# Patient Record
Sex: Male | Born: 1945 | Race: White | Hispanic: No | Smoking: Never smoker
Health system: Southern US, Community
[De-identification: ages and names within clinical notes are randomized; demographics above are authoritative.]

## PROBLEM LIST (undated history)

## (undated) DIAGNOSIS — E785 Hyperlipidemia, unspecified: Secondary | ICD-10-CM

## (undated) DIAGNOSIS — I10 Essential (primary) hypertension: Secondary | ICD-10-CM

## (undated) HISTORY — DX: Hyperlipidemia, unspecified: E78.5

## (undated) HISTORY — DX: Essential (primary) hypertension: I10

---

## 1998-07-14 ENCOUNTER — Encounter: Admission: RE | Admit: 1998-07-14 | Discharge: 1998-07-14 | Payer: Self-pay | Admitting: *Deleted

## 2010-09-30 ENCOUNTER — Encounter: Payer: Self-pay | Admitting: Family Medicine

## 2010-09-30 DIAGNOSIS — I10 Essential (primary) hypertension: Secondary | ICD-10-CM | POA: Insufficient documentation

## 2012-09-30 ENCOUNTER — Telehealth: Payer: Self-pay | Admitting: Family Medicine

## 2012-09-30 MED ORDER — BENAZEPRIL-HYDROCHLOROTHIAZIDE 10-12.5 MG PO TABS: 1.0000 | ORAL_TABLET | Freq: Every day | ORAL | Status: DC

## 2012-09-30 NOTE — Telephone Encounter (Signed)
Rx Refilled  

## 2013-03-03 ENCOUNTER — Encounter: Payer: Self-pay | Admitting: Family Medicine

## 2013-03-03 ENCOUNTER — Ambulatory Visit (INDEPENDENT_AMBULATORY_CARE_PROVIDER_SITE_OTHER): Payer: Medicare PPO | Admitting: Family Medicine

## 2013-03-03 VITALS — BP 134/86 | HR 62 | Temp 98.1°F | Resp 16 | Ht 70.0 in | Wt 196.0 lb

## 2013-03-03 DIAGNOSIS — I10 Essential (primary) hypertension: Secondary | ICD-10-CM

## 2013-03-03 DIAGNOSIS — Z125 Encounter for screening for malignant neoplasm of prostate: Secondary | ICD-10-CM

## 2013-03-03 LAB — COMPLETE METABOLIC PANEL WITH GFR
Albumin: 4.1 g/dL (ref 3.5–5.2)
CO2: 28 mEq/L (ref 19–32)
GFR, Est African American: 82 mL/min
GFR, Est Non African American: 71 mL/min
Glucose, Bld: 91 mg/dL (ref 70–99)
Potassium: 4.4 mEq/L (ref 3.5–5.3)
Sodium: 138 mEq/L (ref 135–145)
Total Protein: 7 g/dL (ref 6.0–8.3)

## 2013-03-03 LAB — PSA: PSA: 0.37 ng/mL (ref <=4.00)

## 2013-03-03 MED ORDER — BENAZEPRIL-HYDROCHLOROTHIAZIDE 10-12.5 MG PO TABS: 1.0000 | ORAL_TABLET | Freq: Every day | ORAL | Status: DC

## 2013-03-03 NOTE — Progress Notes (Signed)
  Subjective:    Patient ID: Daniel Gallagher, male    DOB: Sep 06, 1945, 67 y.o.   MRN: [id number]  HPI  Patient is a very pleasant 67 year old white male who comes in for followup of his hypertension. He is currently taking Lotensin HCT 10/12.5 one tablet by mouth daily. He denies any chest pain, shortness of breath, dyspnea on exertion, cough, or cramps. He is overdue to have his prostate checked. He declines a rectal exam today but he will consent to PSA. He is also overdue to have his lab work drawn. He had a pneumonia vaccine last year. He declines a flu shot. He declines a shingle shot. He also declines a colonoscopy. We continue to recommend these every year but the patient is not interested at this time. Past Medical History  Diagnosis Date  . Hypertension    No current outpatient prescriptions on file prior to visit.   No current facility-administered medications on file prior to visit.   Allergies  Allergen Reactions  . Statins Other (See Comments)    Severe myalgias   History   Social History  . Marital Status: Unknown    Spouse Name: N/A    Number of Children: N/A  . Years of Education: N/A   Occupational History  . Not on file.   Social History Main Topics  . Smoking status: Never Smoker   . Smokeless tobacco: Not on file  . Alcohol Use: No  . Drug Use: No  . Sexual Activity:    Other Topics Concern  . Not on file   Social History Narrative  . No narrative on file     Review of Systems  All other systems reviewed and are negative.       Objective:   Physical Exam  Vitals reviewed. Constitutional: He appears well-developed and well-nourished.  Neck: Neck supple. No JVD present. No thyromegaly present.  Cardiovascular: Normal rate, regular rhythm, normal heart sounds and intact distal pulses.  Exam reveals no gallop and no friction rub.   No murmur heard. Pulmonary/Chest: Effort normal and breath sounds normal. No respiratory distress. He has no wheezes. He  has no rales. He exhibits no tenderness.  Abdominal: Soft. Bowel sounds are normal. He exhibits no distension and no mass. There is no tenderness. There is no rebound and no guarding.  Musculoskeletal: He exhibits no edema.  Lymphadenopathy:    He has no cervical adenopathy.          Assessment & Plan:  1. HTN (hypertension) Patient's blood pressures acceptable today. I refilled his medication for a year. I will check a CMP and a fasting lipid panel. His goal LDL would be less than 130. Also make sure that his renal function and potassium are acceptable - COMPLETE METABOLIC PANEL WITH GFR - Lipid panel  2. Prostate cancer screening H. declines digital rectal exam today. He will consent to PSA. He also declines a colonoscopy, and a shingle shot, and the flu shot. He understands the risk and benefits of shots/ test and elected not to proceed with any. - PSA

## 2014-03-10 ENCOUNTER — Encounter: Payer: Self-pay | Admitting: Family Medicine

## 2014-03-10 ENCOUNTER — Ambulatory Visit (INDEPENDENT_AMBULATORY_CARE_PROVIDER_SITE_OTHER): Payer: Medicare PPO | Admitting: Family Medicine

## 2014-03-10 VITALS — BP 126/80 | HR 56 | Temp 97.6°F | Resp 18 | Ht 69.0 in | Wt 193.0 lb

## 2014-03-10 DIAGNOSIS — I10 Essential (primary) hypertension: Secondary | ICD-10-CM

## 2014-03-10 DIAGNOSIS — E785 Hyperlipidemia, unspecified: Secondary | ICD-10-CM

## 2014-03-10 LAB — COMPLETE METABOLIC PANEL WITH GFR
ALBUMIN: 4.2 g/dL (ref 3.5–5.2)
ALK PHOS: 57 U/L (ref 39–117)
ALT: 25 U/L (ref 0–53)
AST: 19 U/L (ref 0–37)
BUN: 11 mg/dL (ref 6–23)
CO2: 29 mEq/L (ref 19–32)
CREATININE: 1.03 mg/dL (ref 0.50–1.35)
Calcium: 9.5 mg/dL (ref 8.4–10.5)
Chloride: 101 mEq/L (ref 96–112)
GFR, EST NON AFRICAN AMERICAN: 74 mL/min
GFR, Est African American: 86 mL/min
GLUCOSE: 93 mg/dL (ref 70–99)
Potassium: 4.3 mEq/L (ref 3.5–5.3)
Sodium: 138 mEq/L (ref 135–145)
Total Bilirubin: 0.6 mg/dL (ref 0.2–1.2)
Total Protein: 7.1 g/dL (ref 6.0–8.3)

## 2014-03-10 LAB — LIPID PANEL
CHOL/HDL RATIO: 5.9 ratio
CHOLESTEROL: 231 mg/dL — AB (ref 0–200)
HDL: 39 mg/dL — ABNORMAL LOW (ref >39)
LDL Cholesterol: 164 mg/dL — ABNORMAL HIGH (ref 0–99)
TRIGLYCERIDES: 141 mg/dL (ref <150)
VLDL: 28 mg/dL (ref 0–40)

## 2014-03-10 NOTE — Progress Notes (Signed)
   Subjective:    Patient ID: Daniel Gallagher, male    DOB: 02/24/1946, 68 y.o.   MRN: [id number]  HPI Patient has a history of HTN and is currently on lotensin hctz 10/12.5 poqday.  He also has a history of hld with ldl >200 but refuses statins due to a history of myalgias.  He denies any cp, sob, doe.  He refuses flu shot, prevnar, zostavax.   Past Medical History  Diagnosis Date  . Hypertension   . Hyperlipidemia    No past surgical history on file. Current Outpatient Prescriptions on File Prior to Visit  Medication Sig Dispense Refill  . benazepril-hydrochlorthiazide (LOTENSIN HCT) 10-12.5 MG per tablet Take 1 tablet by mouth daily.  30 tablet  11   No current facility-administered medications on file prior to visit.   Allergies  Allergen Reactions  . Statins Other (See Comments)    Severe myalgias   History   Social History  . Marital Status: Unknown    Spouse Name: N/A    Number of Children: N/A  . Years of Education: N/A   Occupational History  . Not on file.   Social History Main Topics  . Smoking status: Never Smoker   . Smokeless tobacco: Never Used  . Alcohol Use: No  . Drug Use: No  . Sexual Activity: Not on file   Other Topics Concern  . Not on file   Social History Narrative  . No narrative on file      Review of Systems  All other systems reviewed and are negative.      Objective:   Physical Exam  Vitals reviewed. Constitutional: He appears well-developed and well-nourished.  Neck: Neck supple. No JVD present. No thyromegaly present.  Cardiovascular: Normal rate, regular rhythm and normal heart sounds.   No murmur heard. Pulmonary/Chest: Effort normal and breath sounds normal. No respiratory distress. He has no wheezes. He has no rales.  Abdominal: Soft. Bowel sounds are normal. He exhibits no distension. There is no tenderness. There is no rebound and no guarding.  Musculoskeletal: He exhibits no edema.  Lymphadenopathy:    He has no cervical  adenopathy.          Assessment & Plan:  Essential hypertension - Plan: COMPLETE METABOLIC PANEL WITH GFR, Lipid panel  HLD (hyperlipidemia)  Blood pressure is excellent.  Check FLP.  If ldl is greater than 130, I would recommend zetia.

## 2014-03-13 ENCOUNTER — Telehealth: Payer: Self-pay | Admitting: Family Medicine

## 2014-03-13 MED ORDER — EZETIMIBE 10 MG PO TABS: 10.0000 mg | ORAL_TABLET | Freq: Every day | ORAL | Status: DC

## 2014-03-13 NOTE — Telephone Encounter (Signed)
Pt aware of lab results and provider recommendations.  Zetia rx to pharmacy

## 2014-03-13 NOTE — Telephone Encounter (Signed)
Message copied by Donne Anon on Fri Mar 13, 2014  4:52 PM ------      Message from: Lynnea Ferrier      Created: Thu Mar 12, 2014  7:26 AM       ldl is still poor.  I would suggest zetia 10 mg poqday and recheck in 3 months. ------

## 2014-03-30 ENCOUNTER — Other Ambulatory Visit: Payer: Self-pay | Admitting: *Deleted

## 2014-03-30 MED ORDER — BENAZEPRIL-HYDROCHLOROTHIAZIDE 10-12.5 MG PO TABS
1.0000 | ORAL_TABLET | Freq: Every day | ORAL | Status: DC
Start: 2014-03-30 — End: 2015-03-26

## 2014-03-30 NOTE — Telephone Encounter (Signed)
Received fax requesting refill on Benaepril/HCTZ.   Refill appropriate and filled per protocol.

## 2015-03-04 ENCOUNTER — Encounter: Payer: Self-pay | Admitting: Family Medicine

## 2015-03-04 ENCOUNTER — Ambulatory Visit (INDEPENDENT_AMBULATORY_CARE_PROVIDER_SITE_OTHER): Payer: Medicare PPO | Admitting: Family Medicine

## 2015-03-04 VITALS — BP 138/88 | HR 60 | Temp 98.0°F | Resp 16 | Wt 193.0 lb

## 2015-03-04 DIAGNOSIS — Z125 Encounter for screening for malignant neoplasm of prostate: Secondary | ICD-10-CM

## 2015-03-04 DIAGNOSIS — E785 Hyperlipidemia, unspecified: Secondary | ICD-10-CM

## 2015-03-04 DIAGNOSIS — I10 Essential (primary) hypertension: Secondary | ICD-10-CM

## 2015-03-04 LAB — COMPLETE METABOLIC PANEL WITH GFR
ALT: 20 U/L (ref 9–46)
AST: 17 U/L (ref 10–35)
Albumin: 4 g/dL (ref 3.6–5.1)
Alkaline Phosphatase: 65 U/L (ref 40–115)
BILIRUBIN TOTAL: 0.7 mg/dL (ref 0.2–1.2)
BUN: 12 mg/dL (ref 7–25)
CALCIUM: 9.4 mg/dL (ref 8.6–10.3)
CO2: 25 mmol/L (ref 20–31)
CREATININE: 0.91 mg/dL (ref 0.70–1.25)
Chloride: 103 mmol/L (ref 98–110)
GFR, Est Non African American: 86 mL/min (ref >=60)
Glucose, Bld: 87 mg/dL (ref 70–99)
Potassium: 4 mmol/L (ref 3.5–5.3)
Sodium: 139 mmol/L (ref 135–146)
TOTAL PROTEIN: 6.7 g/dL (ref 6.1–8.1)

## 2015-03-04 LAB — LIPID PANEL
CHOLESTEROL: 255 mg/dL — AB (ref 125–200)
HDL: 43 mg/dL (ref >=40)
LDL CALC: 186 mg/dL — AB (ref <130)
TRIGLYCERIDES: 130 mg/dL (ref <150)
Total CHOL/HDL Ratio: 5.9 Ratio — ABNORMAL HIGH (ref <=5.0)
VLDL: 26 mg/dL (ref <30)

## 2015-03-04 NOTE — Progress Notes (Signed)
   Subjective:    Patient ID: Daniel Gallagher., male    DOB: Mar 11, 1946, 69 y.o.   MRN: [id number]  HPI Patient is here today for recheck. He quit zetia due to episodes of vertigo that he attributed to the medication. He has been on no medication for almost a year. He is taking his blood pressure medication. He denies any chest pain shortness breath or dyspnea on exertion. He refuses a flu shot today as well as Prevnar 13. He will consent to a PSA but not the prostate exam. He refuses the shingles vaccine Past Medical History  Diagnosis Date  . Hypertension   . Hyperlipidemia    No past surgical history on file. Current Outpatient Prescriptions on File Prior to Visit  Medication Sig Dispense Refill  . benazepril-hydrochlorthiazide (LOTENSIN HCT) 10-12.5 MG per tablet Take 1 tablet by mouth daily. 30 tablet 11  . ezetimibe (ZETIA) 10 MG tablet Take 1 tablet (10 mg total) by mouth daily. 30 tablet 2   No current facility-administered medications on file prior to visit.   Allergies  Allergen Reactions  . Statins Other (See Comments)    Severe myalgias   Social History   Social History  . Marital Status: Unknown    Spouse Name: N/A  . Number of Children: N/A  . Years of Education: N/A   Occupational History  . Not on file.   Social History Main Topics  . Smoking status: Never Smoker   . Smokeless tobacco: Never Used  . Alcohol Use: No  . Drug Use: No  . Sexual Activity: Not on file   Other Topics Concern  . Not on file   Social History Narrative  . No narrative on file     Review of Systems  All other systems reviewed and are negative.      Objective:   Physical Exam  Constitutional: He appears well-developed and well-nourished.  Cardiovascular: Normal rate, regular rhythm and normal heart sounds.   No murmur heard. Pulmonary/Chest: Effort normal and breath sounds normal. No respiratory distress. He has no wheezes. He has no rales.  Abdominal: Soft. Bowel sounds  are normal. He exhibits no distension. There is no tenderness. There is no rebound and no guarding.  Vitals reviewed.         Assessment & Plan:  HLD (hyperlipidemia) - Plan: COMPLETE METABOLIC PANEL WITH GFR, Lipid panel  Essential hypertension - Plan: COMPLETE METABOLIC PANEL WITH GFR, Lipid panel  Prostate cancer screening - Plan: PSA, Medicare  I tried to explain to the patient that zetia likely had nothing to do with his vertigo. I will check a cholesterol and his LDL cholesterol is greater than 1:30 I would ask him to resume that again.Marland Kitchen His blood pressures acceptable. I will check a PSA. He refuses the flu shot, the shingles vaccine, and Prevnar 13

## 2015-03-05 ENCOUNTER — Other Ambulatory Visit: Payer: Self-pay | Admitting: Family Medicine

## 2015-03-05 LAB — PSA, MEDICARE: PSA: 0.45 ng/mL (ref <=4.00)

## 2015-03-05 MED ORDER — EZETIMIBE 10 MG PO TABS: 10.0000 mg | ORAL_TABLET | Freq: Every day | ORAL | Status: DC

## 2015-03-26 ENCOUNTER — Other Ambulatory Visit: Payer: Self-pay | Admitting: Family Medicine

## 2015-03-26 MED ORDER — BENAZEPRIL-HYDROCHLOROTHIAZIDE 10-12.5 MG PO TABS: 1.0000 | ORAL_TABLET | Freq: Every day | ORAL | Status: DC

## 2015-03-26 NOTE — Telephone Encounter (Signed)
Medication refilled per protocol. 

## 2015-03-31 ENCOUNTER — Telehealth: Payer: Self-pay | Admitting: *Deleted

## 2015-03-31 NOTE — Telephone Encounter (Signed)
Received call from patient.   Reports that he resumed Zetia after last FLP. States that SE resumed as well (muscle aches).   States that he is going to stop Zetia and wanted to notify MD.   Requested MD recommend further tx of HLD.   MD please advise.

## 2015-04-01 MED ORDER — FENOFIBRATE 160 MG PO TABS: 160.0000 mg | ORAL_TABLET | Freq: Every day | ORAL | Status: DC

## 2015-04-01 NOTE — Telephone Encounter (Signed)
Try fenofibrate 160 mg poqday and recheck in 3 months.

## 2015-04-01 NOTE — Telephone Encounter (Signed)
Prescription sent to pharmacy.   Call placed to patient. LMTRC.  

## 2015-04-01 NOTE — Telephone Encounter (Signed)
Pt called back and is aware of message and informed script at pharmacy

## 2015-08-17 ENCOUNTER — Ambulatory Visit (INDEPENDENT_AMBULATORY_CARE_PROVIDER_SITE_OTHER): Payer: Commercial Managed Care - HMO | Admitting: Family Medicine

## 2015-08-17 ENCOUNTER — Encounter: Payer: Self-pay | Admitting: Family Medicine

## 2015-08-17 VITALS — BP 136/88 | HR 68 | Temp 97.6°F | Resp 16 | Wt 194.0 lb

## 2015-08-17 DIAGNOSIS — I10 Essential (primary) hypertension: Secondary | ICD-10-CM

## 2015-08-17 DIAGNOSIS — E785 Hyperlipidemia, unspecified: Secondary | ICD-10-CM | POA: Diagnosis not present

## 2015-08-17 NOTE — Progress Notes (Signed)
Subjective:    Patient ID: Daniel Gallagher., male    DOB: 01-Jan-1946, 70 y.o.   MRN: [id number]  HPI 03/04/15 Patient is here today for recheck. He quit zetia due to episodes of vertigo that he attributed to the medication. He has been on no medication for almost a year. He is taking his blood pressure medication. He denies any chest pain shortness breath or dyspnea on exertion. He refuses a flu shot today as well as Prevnar 13. He will consent to a PSA but not the prostate exam. He refuses the shingles vaccine.  At that time, my plan was: I tried to explain to the patient that zetia likely had nothing to do with his vertigo. I will check a cholesterol and his LDL cholesterol is greater than 130 I would ask him to resume that again.Marland Kitchen His blood pressures acceptable. I will check a PSA. He refuses the flu shot, the shingles vaccine, and Prevnar 13.  08/17/15 The patient is here today for recheck. He is currently on fenofibrate 160 mg by mouth daily for hyperlipidemia and Lotensin HCTZ for his blood pressure.  His blood pressure today is excellent at 136/88. He denies any chest pain shortness of breath or dyspnea on exertion. Fortunately he has been able to tolerate fenofibrate 160 mg daily. He is overdue for fasting lab work area and he continues to refuse Prevnar, the flu shot, and the shingles vaccine. He continues to decline a colonoscopy. However today I did discuss cologuard with the patientand he is interested in this test. He declines hepatitis C screening Past Medical History  Diagnosis Date  . Hypertension   . Hyperlipidemia    No past surgical history on file. Current Outpatient Prescriptions on File Prior to Visit  Medication Sig Dispense Refill  . benazepril-hydrochlorthiazide (LOTENSIN HCT) 10-12.5 MG tablet Take 1 tablet by mouth daily. 90 tablet 1  . fenofibrate 160 MG tablet Take 1 tablet (160 mg total) by mouth daily. 30 tablet 3   No current facility-administered medications on  file prior to visit.   Allergies  Allergen Reactions  . Statins Other (See Comments)    Severe myalgias  . Zetia [Ezetimibe] Other (See Comments)    Severe dizziness   Social History   Social History  . Marital Status: Unknown    Spouse Name: N/A  . Number of Children: N/A  . Years of Education: N/A   Occupational History  . Not on file.   Social History Main Topics  . Smoking status: Never Smoker   . Smokeless tobacco: Never Used  . Alcohol Use: No  . Drug Use: No  . Sexual Activity: Not on file   Other Topics Concern  . Not on file   Social History Narrative     Review of Systems  All other systems reviewed and are negative.      Objective:   Physical Exam  Constitutional: He appears well-developed and well-nourished.  Cardiovascular: Normal rate, regular rhythm and normal heart sounds.   No murmur heard. Pulmonary/Chest: Effort normal and breath sounds normal. No respiratory distress. He has no wheezes. He has no rales.  Abdominal: Soft. Bowel sounds are normal. He exhibits no distension. There is no tenderness. There is no rebound and no guarding.  Vitals reviewed.         Assessment & Plan:  Essential hypertension  HLD (hyperlipidemia) - Plan: CBC with Differential/Platelet, COMPLETE METABOLIC PANEL WITH GFR, Lipid panel  Blood pressure is well controlled.  I will have the patient return fasting for a CBC, CMP, fasting lipid panel. Goal LDL cholesterol is less than 130. He is also willing to consent to a cologuard.  While not the goal standard for colon cancer screening, this is the most that I can commence this patient to do and I believe it is better than nothing. He can pick this up when he returns for his lab work.

## 2015-08-20 ENCOUNTER — Other Ambulatory Visit: Payer: Self-pay | Admitting: Family Medicine

## 2015-08-20 MED ORDER — FENOFIBRATE 160 MG PO TABS: 160.0000 mg | ORAL_TABLET | Freq: Every day | ORAL | Status: DC

## 2015-08-20 MED ORDER — BENAZEPRIL-HYDROCHLOROTHIAZIDE 10-12.5 MG PO TABS: 1.0000 | ORAL_TABLET | Freq: Every day | ORAL | Status: DC

## 2015-08-20 NOTE — Telephone Encounter (Signed)
Medication called/sent to requested pharmacy  

## 2015-08-22 DIAGNOSIS — Z1211 Encounter for screening for malignant neoplasm of colon: Secondary | ICD-10-CM | POA: Diagnosis not present

## 2015-08-22 DIAGNOSIS — Z1212 Encounter for screening for malignant neoplasm of rectum: Secondary | ICD-10-CM | POA: Diagnosis not present

## 2015-09-03 LAB — COLOGUARD: Cologuard: NEGATIVE

## 2015-09-10 ENCOUNTER — Encounter: Payer: Self-pay | Admitting: Family Medicine

## 2015-09-10 ENCOUNTER — Telehealth: Payer: Self-pay | Admitting: Family Medicine

## 2015-09-10 NOTE — Telephone Encounter (Signed)
Cologuard results were negative - Pt is aware of results via mailed letter

## 2015-11-04 ENCOUNTER — Encounter: Payer: Self-pay | Admitting: Family Medicine

## 2016-08-16 ENCOUNTER — Other Ambulatory Visit: Payer: Self-pay | Admitting: Family Medicine

## 2016-09-18 ENCOUNTER — Other Ambulatory Visit: Payer: Self-pay | Admitting: Family Medicine

## 2016-11-14 ENCOUNTER — Other Ambulatory Visit: Payer: Self-pay | Admitting: Family Medicine

## 2016-12-04 ENCOUNTER — Ambulatory Visit (INDEPENDENT_AMBULATORY_CARE_PROVIDER_SITE_OTHER): Payer: Medicare HMO | Admitting: Family Medicine

## 2016-12-04 ENCOUNTER — Encounter: Payer: Self-pay | Admitting: Family Medicine

## 2016-12-04 VITALS — BP 130/78 | HR 60 | Temp 97.7°F | Resp 14 | Ht 69.0 in | Wt 194.0 lb

## 2016-12-04 DIAGNOSIS — Z1211 Encounter for screening for malignant neoplasm of colon: Secondary | ICD-10-CM | POA: Diagnosis not present

## 2016-12-04 DIAGNOSIS — E7800 Pure hypercholesterolemia, unspecified: Secondary | ICD-10-CM | POA: Diagnosis not present

## 2016-12-04 DIAGNOSIS — Z125 Encounter for screening for malignant neoplasm of prostate: Secondary | ICD-10-CM

## 2016-12-04 LAB — CBC WITH DIFFERENTIAL/PLATELET
BASOS ABS: 0 {cells}/uL (ref 0–200)
Basophils Relative: 0 %
EOS ABS: 106 {cells}/uL (ref 15–500)
Eosinophils Relative: 2 %
HEMATOCRIT: 44 % (ref 38.5–50.0)
Hemoglobin: 15 g/dL (ref 13.0–17.0)
LYMPHS PCT: 33 %
Lymphs Abs: 1749 cells/uL (ref 850–3900)
MCH: 29.4 pg (ref 27.0–33.0)
MCHC: 34.1 g/dL (ref 32.0–36.0)
MCV: 86.1 fL (ref 80.0–100.0)
MONOS PCT: 9 %
MPV: 11.6 fL (ref 7.5–12.5)
Monocytes Absolute: 477 cells/uL (ref 200–950)
NEUTROS ABS: 2968 {cells}/uL (ref 1500–7800)
Neutrophils Relative %: 56 %
PLATELETS: 219 10*3/uL (ref 140–400)
RBC: 5.11 MIL/uL (ref 4.20–5.80)
RDW: 13 % (ref 11.0–15.0)
WBC: 5.3 10*3/uL (ref 3.8–10.8)

## 2016-12-04 LAB — COMPLETE METABOLIC PANEL WITH GFR
ALBUMIN: 4.1 g/dL (ref 3.6–5.1)
ALT: 16 U/L (ref 9–46)
AST: 18 U/L (ref 10–35)
Alkaline Phosphatase: 38 U/L — ABNORMAL LOW (ref 40–115)
BILIRUBIN TOTAL: 0.6 mg/dL (ref 0.2–1.2)
BUN: 15 mg/dL (ref 7–25)
CALCIUM: 9.5 mg/dL (ref 8.6–10.3)
CO2: 26 mmol/L (ref 20–31)
Chloride: 104 mmol/L (ref 98–110)
Creat: 1.17 mg/dL (ref 0.70–1.18)
GFR, EST AFRICAN AMERICAN: 72 mL/min (ref >=60)
GFR, EST NON AFRICAN AMERICAN: 62 mL/min (ref >=60)
Glucose, Bld: 89 mg/dL (ref 70–99)
Potassium: 4.5 mmol/L (ref 3.5–5.3)
Sodium: 139 mmol/L (ref 135–146)
TOTAL PROTEIN: 6.9 g/dL (ref 6.1–8.1)

## 2016-12-04 LAB — LIPID PANEL
CHOLESTEROL: 227 mg/dL — AB (ref <200)
HDL: 49 mg/dL (ref >40)
LDL Cholesterol: 162 mg/dL — ABNORMAL HIGH (ref <100)
TRIGLYCERIDES: 80 mg/dL (ref <150)
Total CHOL/HDL Ratio: 4.6 Ratio (ref <5.0)
VLDL: 16 mg/dL (ref <30)

## 2016-12-04 NOTE — Progress Notes (Signed)
Subjective:    Patient ID: Daniel Gallagher., male    DOB: [birth year], 71 y.o.   MRN: [id number]  HPI9/15/16 Patient is here today for recheck. He quit zetia due to episodes of vertigo that he attributed to the medication. He has been on no medication for almost a year. He is taking his blood pressure medication. He denies any chest pain shortness breath or dyspnea on exertion. He refuses a flu shot today as well as Prevnar 13. He will consent to a PSA but not the prostate exam. He refuses the shingles vaccine.  At that time, my plan was: I tried to explain to the patient that zetia likely had nothing to do with his vertigo. I will check a cholesterol and his LDL cholesterol is greater than 130 I would ask him to resume that again.Marland Kitchen His blood pressures acceptable. I will check a PSA. He refuses the flu shot, the shingles vaccine, and Prevnar 13.  08/17/15 The patient is here today for recheck. He is currently on fenofibrate 160 mg by mouth daily for hyperlipidemia and Lotensin HCTZ for his blood pressure.  His blood pressure today is excellent at 136/88. He denies any chest pain shortness of breath or dyspnea on exertion. Fortunately he has been able to tolerate fenofibrate 160 mg daily. He is overdue for fasting lab work area and he continues to refuse Prevnar, the flu shot, and the shingles vaccine. He continues to decline a colonoscopy. However today I did discuss cologuard with the patientand he is interested in this test. He declines hepatitis C screening.  At that time, my plan was: Blood pressure is well controlled. I will have the patient return fasting for a CBC, CMP, fasting lipid panel. Goal LDL cholesterol is less than 130. He is also willing to consent to a cologuard.  While not the goal standard for colon cancer screening, this is the most that I can commence this patient to do and I believe it is better than nothing. He can pick this up when he returns for his lab work.  12/04/16 He is here  today for recheck. His blood pressure remains well controlled at 130/78. He denies any chest pain shortness of breath or dyspnea on exertion. He tries to exercise every day. He is currently taking fenofibrate for hyperlipidemia. He cannot tolerate statins. He cannot tolerate zetia.  He denies any side effects on the fenofibrate. Particularly he denies any myalgias or right upper quadrant pain. He continues to decline colonoscopy but he will consent to fecal occult blood cards 3. He also would like to check a PSA to screen for prostate cancer. He declines hepatitis C screening. Declines a tetanus shot Past Medical History:  Diagnosis Date  . Hyperlipidemia   . Hypertension    No past surgical history on file. Current Outpatient Prescriptions on File Prior to Visit  Medication Sig Dispense Refill  . benazepril-hydrochlorthiazide (LOTENSIN HCT) 10-12.5 MG tablet take 1 tablet by mouth once daily 90 tablet 3  . fenofibrate 160 MG tablet Take 1 tablet (160 mg total) by mouth daily. 30 tablet 0   No current facility-administered medications on file prior to visit.    Allergies  Allergen Reactions  . Statins Other (See Comments)    Severe myalgias  . Zetia [Ezetimibe] Other (See Comments)    Severe dizziness   Social History   Social History  . Marital status: Unknown    Spouse name: N/A  . Number of children: N/A  .  Years of education: N/A   Occupational History  . Not on file.   Social History Main Topics  . Smoking status: Never Smoker  . Smokeless tobacco: Never Used  . Alcohol use No  . Drug use: No  . Sexual activity: Not on file   Other Topics Concern  . Not on file   Social History Narrative  . No narrative on file     Review of Systems  All other systems reviewed and are negative.      Objective:   Physical Exam  Constitutional: He appears well-developed and well-nourished.  Cardiovascular: Normal rate, regular rhythm and normal heart sounds.   No murmur  heard. Pulmonary/Chest: Effort normal and breath sounds normal. No respiratory distress. He has no wheezes. He has no rales.  Abdominal: Soft. Bowel sounds are normal. He exhibits no distension. There is no tenderness. There is no rebound and no guarding.  Vitals reviewed.         Assessment & Plan:  Pure hypercholesterolemia - Plan: CBC with Differential/Platelet, COMPLETE METABOLIC PANEL WITH GFR, Fecal occult blood, imunochemical, Fecal occult blood, imunochemical, Fecal occult blood, imunochemical, Lipid panel  Colon cancer screening - Plan: Fecal occult blood, imunochemical, Fecal occult blood, imunochemical, Fecal occult blood, imunochemical  Prostate cancer screening - Plan: PSA  Blood pressures well controlled. I'll make no changes to his blood pressure medication. I will check a CBC, CMP, fasting lipid panel. I will be willing to accept an LDL cholesterol less than 161130. I will screen the patient for prostate cancer with a PSA. I will also check fecal occult blood cards 3 to screen for colon cancer given the fact the patient declines a colonoscopy.

## 2016-12-05 LAB — PSA: PSA: 0.3 ng/mL (ref <=4.0)

## 2016-12-11 ENCOUNTER — Other Ambulatory Visit: Payer: Self-pay | Admitting: Family Medicine

## 2016-12-11 DIAGNOSIS — E7800 Pure hypercholesterolemia, unspecified: Secondary | ICD-10-CM | POA: Diagnosis not present

## 2016-12-11 DIAGNOSIS — Z1211 Encounter for screening for malignant neoplasm of colon: Secondary | ICD-10-CM | POA: Diagnosis not present

## 2016-12-12 ENCOUNTER — Other Ambulatory Visit: Payer: Self-pay | Admitting: Family Medicine

## 2016-12-12 DIAGNOSIS — E7800 Pure hypercholesterolemia, unspecified: Secondary | ICD-10-CM | POA: Diagnosis not present

## 2016-12-12 DIAGNOSIS — Z1211 Encounter for screening for malignant neoplasm of colon: Secondary | ICD-10-CM | POA: Diagnosis not present

## 2016-12-13 ENCOUNTER — Other Ambulatory Visit: Payer: Self-pay | Admitting: Family Medicine

## 2016-12-13 ENCOUNTER — Other Ambulatory Visit: Payer: Medicare HMO

## 2016-12-13 DIAGNOSIS — Z1211 Encounter for screening for malignant neoplasm of colon: Secondary | ICD-10-CM | POA: Diagnosis not present

## 2016-12-13 DIAGNOSIS — E7800 Pure hypercholesterolemia, unspecified: Secondary | ICD-10-CM | POA: Diagnosis not present

## 2016-12-14 ENCOUNTER — Other Ambulatory Visit: Payer: Self-pay | Admitting: Family Medicine

## 2016-12-15 LAB — FECAL GLOBIN BY IMMUNOCHEMISTRY
FECAL GLOBIN IMMUNO: NOT DETECTED
FECAL GLOBIN IMMUNO: NOT DETECTED
Fecal Globin Immuno: NOT DETECTED

## 2017-09-13 ENCOUNTER — Other Ambulatory Visit: Payer: Self-pay | Admitting: Family Medicine

## 2017-09-13 MED ORDER — FENOFIBRATE 160 MG PO TABS: 160.0000 mg | ORAL_TABLET | Freq: Every day | ORAL | 1 refills | Status: DC

## 2017-09-14 ENCOUNTER — Other Ambulatory Visit: Payer: Self-pay | Admitting: Family Medicine

## 2017-09-14 MED ORDER — BENAZEPRIL-HYDROCHLOROTHIAZIDE 10-12.5 MG PO TABS: 1.0000 | ORAL_TABLET | Freq: Every day | ORAL | 3 refills | Status: DC

## 2018-09-10 ENCOUNTER — Other Ambulatory Visit: Payer: Self-pay | Admitting: Family Medicine

## 2019-06-17 ENCOUNTER — Ambulatory Visit (INDEPENDENT_AMBULATORY_CARE_PROVIDER_SITE_OTHER): Payer: Medicare HMO | Admitting: Family Medicine

## 2019-06-17 ENCOUNTER — Other Ambulatory Visit: Payer: Self-pay

## 2019-06-17 VITALS — BP 138/90 | HR 64 | Temp 98.3°F | Resp 18 | Wt 197.0 lb

## 2019-06-17 DIAGNOSIS — R361 Hematospermia: Secondary | ICD-10-CM | POA: Diagnosis not present

## 2019-06-17 DIAGNOSIS — Z125 Encounter for screening for malignant neoplasm of prostate: Secondary | ICD-10-CM | POA: Diagnosis not present

## 2019-06-17 DIAGNOSIS — E785 Hyperlipidemia, unspecified: Secondary | ICD-10-CM

## 2019-06-17 DIAGNOSIS — I10 Essential (primary) hypertension: Secondary | ICD-10-CM | POA: Diagnosis not present

## 2019-06-17 DIAGNOSIS — M5432 Sciatica, left side: Secondary | ICD-10-CM | POA: Diagnosis not present

## 2019-06-17 LAB — URINALYSIS, ROUTINE W REFLEX MICROSCOPIC
Bilirubin Urine: NEGATIVE
Glucose, UA: NEGATIVE
Hgb urine dipstick: NEGATIVE
Ketones, ur: NEGATIVE
Leukocytes,Ua: NEGATIVE
Nitrite: NEGATIVE
Protein, ur: NEGATIVE
Specific Gravity, Urine: 1.025 (ref 1.001–1.03)
pH: 7 (ref 5.0–8.0)

## 2019-06-17 NOTE — Progress Notes (Signed)
Subjective:    Patient ID: Daniel Gallagher., male    DOB: 1946/02/17, 73 y.o.   MRN: [id number]  HPI Patient presents with several concerns.  First he reports pain in his lower back radiating to his posterior left hip.  The pain radiates down his posterior lateral left hip into his left foot.  The pain is worse with prolonged standing or walking.  He denies any bowel or bladder incontinence.  He denies any leg weakness.  He denies any saddle anesthesia.  The pain has begun gradually with no specific cause however is consistently getting worse.  Patient has a history of hypertension.  Blood pressure today is marginal at 138/90.  He denies any chest pain or shortness of breath.  He has history of dyslipidemia but has stopped taking the fenofibrate due to muscle tightness and muscle pain.  He is no longer taking any medication.  He is overdue for prostate cancer screening and he consents to a PSA today.  He reports 2 separate episodes of Kasparek Mattus permeable after intercourse.  He denies any hematuria.  He denies any dysuria.  He denies any urgency or frequency. Past Medical History:  Diagnosis Date  . Hyperlipidemia   . Hypertension    Current Outpatient Medications on File Prior to Visit  Medication Sig Dispense Refill  . benazepril-hydrochlorthiazide (LOTENSIN HCT) 10-12.5 MG tablet TAKE 1 TABLET BY MOUTH DAILY 90 tablet 3  . fenofibrate 160 MG tablet Take 1 tablet (160 mg total) by mouth daily. 30 tablet 0  . fenofibrate 160 MG tablet Take 1 tablet (160 mg total) by mouth daily. 90 tablet 1   No current facility-administered medications on file prior to visit.   Allergies  Allergen Reactions  . Statins Other (See Comments)    Severe myalgias  . Zetia [Ezetimibe] Other (See Comments)    Severe dizziness   Social History   Socioeconomic History  . Marital status: Unknown    Spouse name: Not on file  . Number of children: Not on file  . Years of education: Not on file  .  Highest education level: Not on file  Occupational History  . Not on file  Tobacco Use  . Smoking status: Never Smoker  . Smokeless tobacco: Never Used  Substance and Sexual Activity  . Alcohol use: No  . Drug use: No  . Sexual activity: Not on file  Other Topics Concern  . Not on file  Social History Narrative  . Not on file   Social Determinants of Health   Financial Resource Strain:   . Difficulty of Paying Living Expenses: Not on file  Food Insecurity:   . Worried About Programme researcher, broadcasting/film/video in the Last Year: Not on file  . Ran Out of Food in the Last Year: Not on file  Transportation Needs:   . Lack of Transportation (Medical): Not on file  . Lack of Transportation (Non-Medical): Not on file  Physical Activity:   . Days of Exercise per Week: Not on file  . Minutes of Exercise per Session: Not on file  Stress:   . Feeling of Stress : Not on file  Social Connections:   . Frequency of Communication with Friends and Family: Not on file  . Frequency of Social Gatherings with Friends and Family: Not on file  . Attends Religious Services: Not on file  . Active Member of Clubs or Organizations: Not on file  . Attends Banker Meetings: Not on  file  . Marital Status: Not on file  Intimate Partner Violence:   . Fear of Current or Ex-Partner: Not on file  . Emotionally Abused: Not on file  . Physically Abused: Not on file  . Sexually Abused: Not on file     Review of Systems  All other systems reviewed and are negative.      Objective:   Physical Exam Vitals reviewed.  Constitutional:      Appearance: Normal appearance.  Cardiovascular:     Rate and Rhythm: Normal rate and regular rhythm.     Pulses: Normal pulses.     Heart sounds: Normal heart sounds. No murmur. No friction rub.  Pulmonary:     Effort: Pulmonary effort is normal. No respiratory distress.     Breath sounds: Normal breath sounds. No wheezing or rales.  Musculoskeletal:     Lumbar  back: Normal. No spasms, tenderness or bony tenderness. Negative right straight leg raise test and negative left straight leg raise test.     Right hip: Normal. No deformity, tenderness, bony tenderness or crepitus. Normal range of motion. Normal strength.     Left hip: Normal. No deformity, tenderness, bony tenderness or crepitus. Normal range of motion. Normal strength.     Right lower leg: No edema.     Left lower leg: No edema.  Neurological:     Mental Status: He is alert.           Assessment & Plan:  Prostate cancer screening - Plan: PSA  Benign essential HTN - Plan: CBC with Differential, COMPLETE METABOLIC PANEL WITH GFR, Lipid Panel  Dyslipidemia - Plan: CBC with Differential, COMPLETE METABOLIC PANEL WITH GFR, Lipid Panel  Hematospermia - Plan: Urinalysis, Routine w reflex microscopic  Left sided sciatica - Plan: DG Lumbar Spine Complete  I will screen the patient for prostate cancer by checking a PSA.  Blood pressure today is adequate.  I will check a CBC CMP and a fasting lipid panel to monitor his dyslipidemia.  I reassured the patient that usually hematospermia is benign however I will check a urinalysis to rule out microscopic hematuria that would warrant a urology consultation.  I will also screen for prostate cancer with PSA.  I believe the patient is having sciatica.  Obtain an x-ray of the lumbar spine to evaluate further.

## 2019-06-18 LAB — CBC WITH DIFFERENTIAL/PLATELET
Absolute Monocytes: 431 cells/uL (ref 200–950)
Basophils Absolute: 30 cells/uL (ref 0–200)
Basophils Relative: 0.5 %
Eosinophils Absolute: 142 cells/uL (ref 15–500)
Eosinophils Relative: 2.4 %
HCT: 44.7 % (ref 38.5–50.0)
Hemoglobin: 15.3 g/dL (ref 13.2–17.1)
Lymphs Abs: 1676 cells/uL (ref 850–3900)
MCH: 29.5 pg (ref 27.0–33.0)
MCHC: 34.2 g/dL (ref 32.0–36.0)
MCV: 86.3 fL (ref 80.0–100.0)
MPV: 12.2 fL (ref 7.5–12.5)
Monocytes Relative: 7.3 %
Neutro Abs: 3623 cells/uL (ref 1500–7800)
Neutrophils Relative %: 61.4 %
Platelets: 211 10*3/uL (ref 140–400)
RBC: 5.18 10*6/uL (ref 4.20–5.80)
RDW: 12.3 % (ref 11.0–15.0)
Total Lymphocyte: 28.4 %
WBC: 5.9 10*3/uL (ref 3.8–10.8)

## 2019-06-18 LAB — COMPLETE METABOLIC PANEL WITH GFR
AG Ratio: 1.6 (calc) (ref 1.0–2.5)
ALT: 14 U/L (ref 9–46)
AST: 13 U/L (ref 10–35)
Albumin: 4 g/dL (ref 3.6–5.1)
Alkaline phosphatase (APISO): 58 U/L (ref 35–144)
BUN: 12 mg/dL (ref 7–25)
CO2: 25 mmol/L (ref 20–32)
Calcium: 9 mg/dL (ref 8.6–10.3)
Chloride: 104 mmol/L (ref 98–110)
Creat: 0.92 mg/dL (ref 0.70–1.18)
GFR, Est African American: 95 mL/min/{1.73_m2} (ref >=60)
GFR, Est Non African American: 82 mL/min/{1.73_m2} (ref >=60)
Globulin: 2.5 g/dL (calc) (ref 1.9–3.7)
Glucose, Bld: 96 mg/dL (ref 65–99)
Potassium: 4.3 mmol/L (ref 3.5–5.3)
Sodium: 140 mmol/L (ref 135–146)
Total Bilirubin: 0.5 mg/dL (ref 0.2–1.2)
Total Protein: 6.5 g/dL (ref 6.1–8.1)

## 2019-06-18 LAB — LIPID PANEL
Cholesterol: 244 mg/dL — ABNORMAL HIGH (ref <200)
HDL: 44 mg/dL (ref >=40)
LDL Cholesterol (Calc): 174 mg/dL (calc) — ABNORMAL HIGH
Non-HDL Cholesterol (Calc): 200 mg/dL (calc) — ABNORMAL HIGH (ref <130)
Total CHOL/HDL Ratio: 5.5 (calc) — ABNORMAL HIGH (ref <5.0)
Triglycerides: 129 mg/dL (ref <150)

## 2019-06-18 LAB — PSA: PSA: 0.2 ng/mL (ref <=4.0)

## 2019-06-19 ENCOUNTER — Ambulatory Visit
Admission: RE | Admit: 2019-06-19 | Discharge: 2019-06-19 | Disposition: A | Discharge status: Home / Self Care | Payer: Medicare HMO | Source: Ambulatory Visit | Attending: Family Medicine | Admitting: Family Medicine

## 2019-06-19 ENCOUNTER — Other Ambulatory Visit: Payer: Self-pay

## 2019-06-19 DIAGNOSIS — M545 Low back pain: Secondary | ICD-10-CM | POA: Diagnosis not present

## 2019-06-19 DIAGNOSIS — M5432 Sciatica, left side: Secondary | ICD-10-CM

## 2019-06-19 MED ORDER — EZETIMIBE 10 MG PO TABS: 10.0000 mg | ORAL_TABLET | Freq: Every day | ORAL | 2 refills | Status: DC

## 2019-08-08 ENCOUNTER — Encounter: Payer: Commercial Managed Care - HMO | Admitting: Family Medicine

## 2019-09-04 ENCOUNTER — Other Ambulatory Visit: Payer: Self-pay | Admitting: Family Medicine

## 2019-09-17 ENCOUNTER — Other Ambulatory Visit: Payer: Self-pay

## 2019-09-17 ENCOUNTER — Other Ambulatory Visit: Payer: Medicare HMO

## 2019-09-17 DIAGNOSIS — E785 Hyperlipidemia, unspecified: Secondary | ICD-10-CM | POA: Diagnosis not present

## 2019-09-17 LAB — LIPID PANEL
Cholesterol: 213 mg/dL — ABNORMAL HIGH (ref <200)
HDL: 42 mg/dL (ref >=40)
LDL Cholesterol (Calc): 149 mg/dL (calc) — ABNORMAL HIGH
Non-HDL Cholesterol (Calc): 171 mg/dL (calc) — ABNORMAL HIGH (ref <130)
Total CHOL/HDL Ratio: 5.1 (calc) — ABNORMAL HIGH (ref <5.0)
Triglycerides: 103 mg/dL (ref <150)

## 2019-09-22 ENCOUNTER — Other Ambulatory Visit: Payer: Self-pay | Admitting: Family Medicine

## 2020-08-30 ENCOUNTER — Other Ambulatory Visit: Payer: Self-pay | Admitting: Family Medicine

## 2020-12-14 ENCOUNTER — Telehealth: Payer: Self-pay | Admitting: *Deleted

## 2020-12-14 NOTE — Telephone Encounter (Signed)
Patient due for Cologuard re-screen.   Order placed via [employer].    Cologuard (Order [id number])

## 2021-03-17 IMAGING — DX DG LUMBAR SPINE COMPLETE 4+V
5 series · 5 of 5 positions shown · non-contrast
Comparison: None.

CLINICAL DATA: Left side sciatica, left low back pain

EXAM:
LUMBAR SPINE - COMPLETE 4+ VIEW

[dg lumbar spine complete 4 +v (1 of 5)]
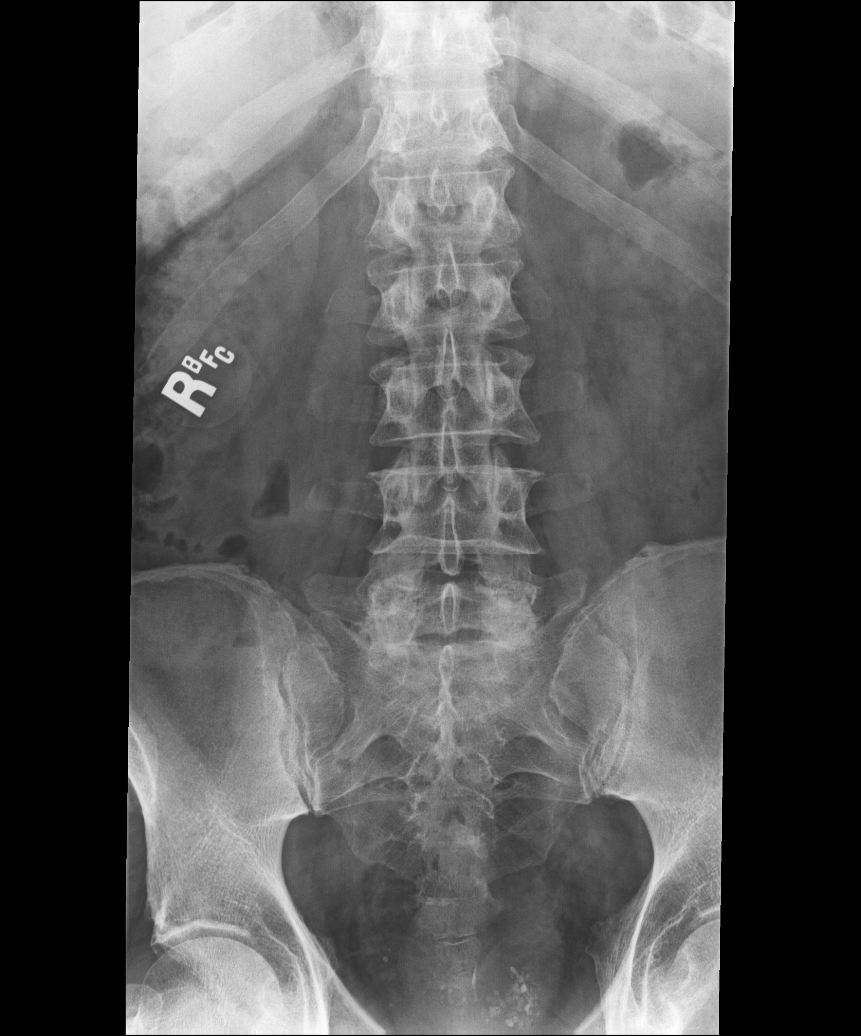

[dg lumbar spine complete 4 +v (2 of 5)]
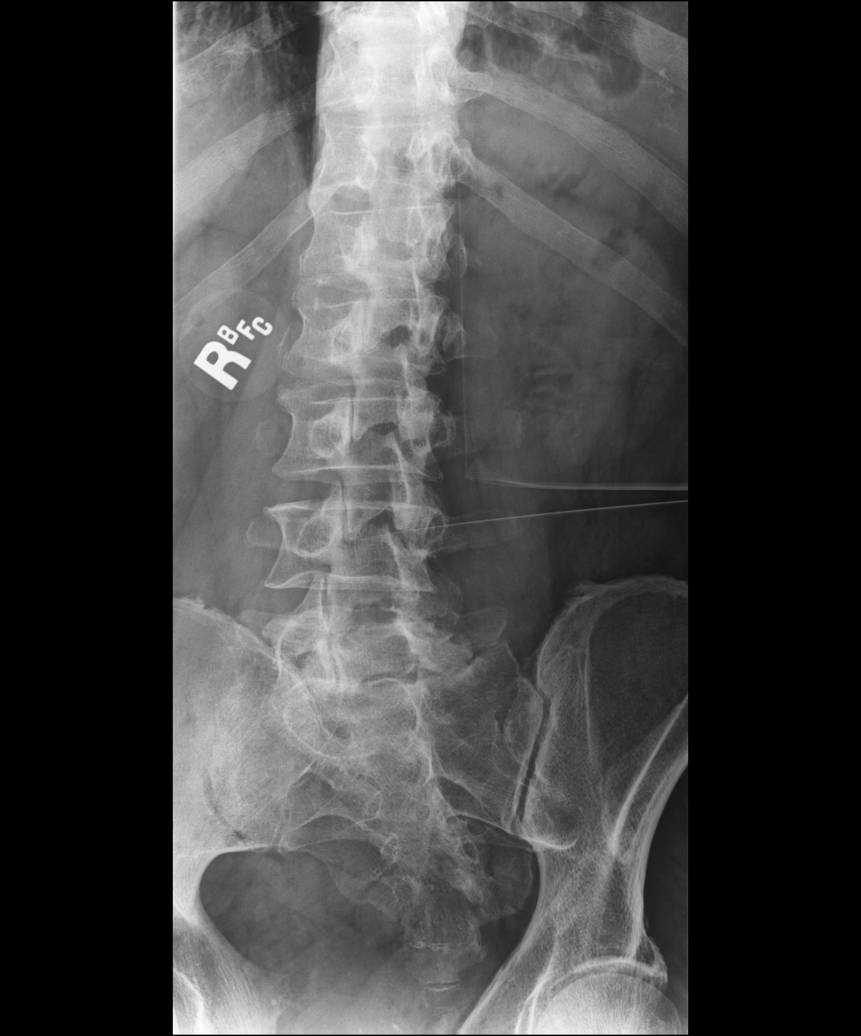

[dg lumbar spine complete 4 +v (3 of 5)]
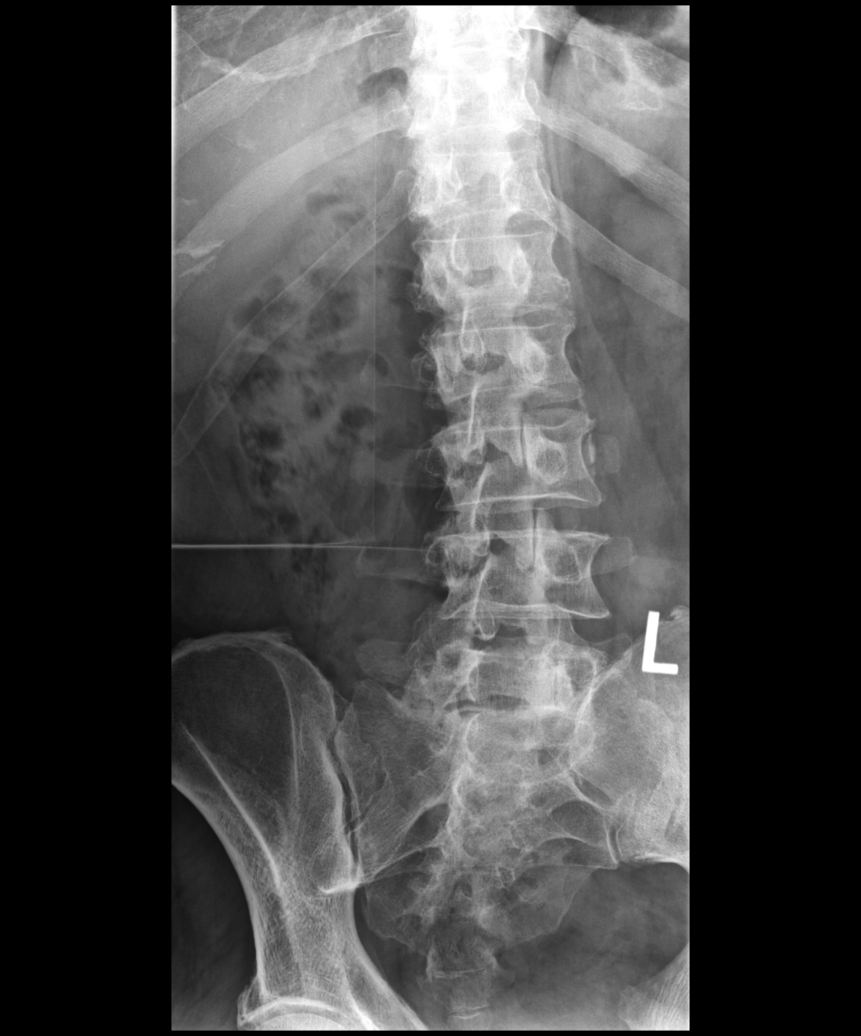

[dg lumbar spine complete 4 +v (4 of 5)]
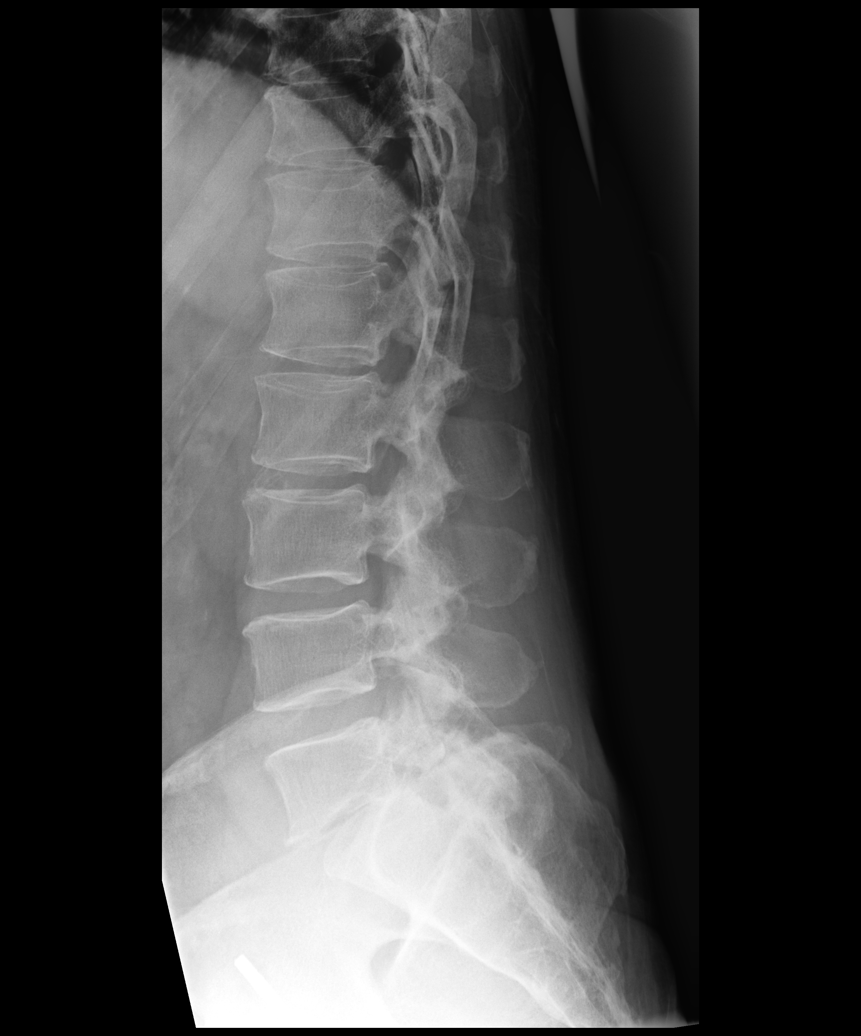

[dg lumbar spine complete 4 +v (5 of 5)]
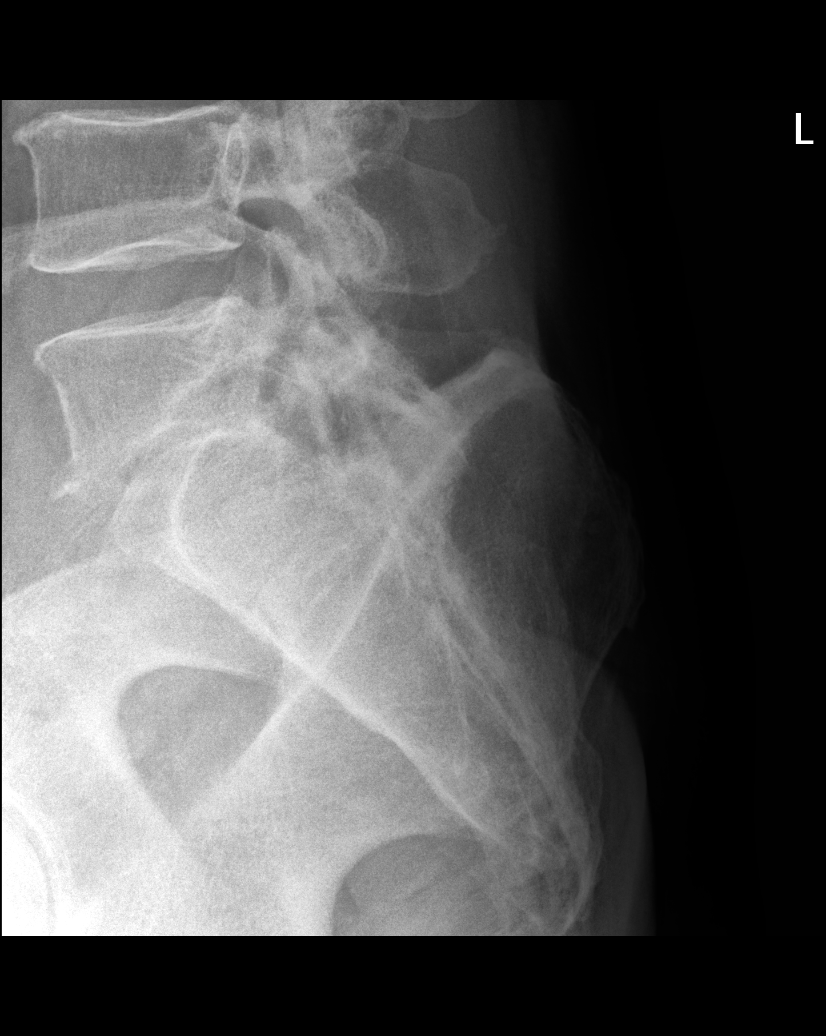

[5 of 5 positions shown; findings below may reference images not displayed]

FINDINGS: Disc space narrowing and spurring at L5-S1. Diffuse degenerative
facet disease, most pronounced from L3-4 through L5-S1. Slight
anterolisthesis of L5 on S1 of approximately 4 mm. No fracture. SI
joints symmetric and unremarkable.
IMPRESSION: Degenerative disc and facet disease as above. No acute bony
abnormality.

## 2021-08-22 ENCOUNTER — Other Ambulatory Visit: Payer: Self-pay | Admitting: Family Medicine

## 2021-08-28 ENCOUNTER — Other Ambulatory Visit: Payer: Self-pay | Admitting: Family Medicine

## 2021-08-29 ENCOUNTER — Other Ambulatory Visit: Payer: Self-pay

## 2021-09-06 ENCOUNTER — Other Ambulatory Visit: Payer: Self-pay

## 2021-09-06 ENCOUNTER — Encounter: Payer: Self-pay | Admitting: Family Medicine

## 2021-09-06 ENCOUNTER — Ambulatory Visit (INDEPENDENT_AMBULATORY_CARE_PROVIDER_SITE_OTHER): Payer: Medicare HMO | Admitting: Family Medicine

## 2021-09-06 VITALS — BP 130/78 | HR 61 | Temp 96.9°F | Ht 69.0 in | Wt 188.4 lb

## 2021-09-06 DIAGNOSIS — Z125 Encounter for screening for malignant neoplasm of prostate: Secondary | ICD-10-CM

## 2021-09-06 DIAGNOSIS — I10 Essential (primary) hypertension: Secondary | ICD-10-CM

## 2021-09-06 NOTE — Progress Notes (Signed)
? ?Subjective:  ? ? Patient ID: Daniel Gallagher., male    DOB: December 09, 1945, 76 y.o.   MRN: [id number] ? ?HPI ?Patient has not been seen in quite some time.  He is due for Prevnar 20 as well as the shingles vaccine.  He declines any vaccinations today due to previous reactions that he has had to vaccinations.  He is due for prostate cancer screening and he will allow me to check a PSA.  He declines colonoscopy.  He is overdue for lab work.  He is not taking Zetia.  He is not taking fenofibrate despite remaining on his medication list.  He states that after he takes a medication for 3 months, his muscles always hurt and he cannot tolerate them.  Therefore he is declining any future treatment for cholesterol.  His blood pressure today is excellent 130/78.  He denies any chest pain shortness of breath or dyspnea on exertion. ?Past Medical History:  ?Diagnosis Date  ? Hyperlipidemia   ? Hypertension   ? ?Current Outpatient Medications on File Prior to Visit  ?Medication Sig Dispense Refill  ? benazepril-hydrochlorthiazide (LOTENSIN HCT) 10-12.5 MG tablet TAKE 1 TABLET BY MOUTH DAILY 90 tablet 3  ? ezetimibe (ZETIA) 10 MG tablet TAKE 1 TABLET(10 MG) BY MOUTH DAILY 90 tablet 1  ? fenofibrate 160 MG tablet Take 1 tablet (160 mg total) by mouth daily. (Patient not taking: Reported on 06/17/2019) 30 tablet 0  ? fenofibrate 160 MG tablet Take 1 tablet (160 mg total) by mouth daily. (Patient not taking: Reported on 06/17/2019) 90 tablet 1  ? ?No current facility-administered medications on file prior to visit.  ? ?Allergies  ?Allergen Reactions  ? Statins Other (See Comments)  ?  Severe myalgias  ? Zetia [Ezetimibe] Other (See Comments)  ?  Severe dizziness  ? ?Social History  ? ?Socioeconomic History  ? Marital status: Unknown  ?  Spouse name: Not on file  ? Number of children: Not on file  ? Years of education: Not on file  ? Highest education level: Not on file  ?Occupational History  ? Not on file  ?Tobacco Use  ? Smoking  status: Never  ? Smokeless tobacco: Never  ?Substance and Sexual Activity  ? Alcohol use: No  ? Drug use: No  ? Sexual activity: Not on file  ?Other Topics Concern  ? Not on file  ?Social History Narrative  ? Not on file  ? ?Social Determinants of Health  ? ?Financial Resource Strain: Not on file  ?Food Insecurity: Not on file  ?Transportation Needs: Not on file  ?Physical Activity: Not on file  ?Stress: Not on file  ?Social Connections: Not on file  ?Intimate Partner Violence: Not on file  ? ? ? ?Review of Systems  ?All other systems reviewed and are negative. ? ?   ?Objective:  ? Physical Exam ?Vitals reviewed.  ?Constitutional:   ?   General: He is not in acute distress. ?   Appearance: Normal appearance. He is normal weight. He is not ill-appearing, toxic-appearing or diaphoretic.  ?Neck:  ?   Vascular: No carotid bruit.  ?Cardiovascular:  ?   Rate and Rhythm: Normal rate and regular rhythm.  ?   Pulses: Normal pulses.  ?   Heart sounds: Normal heart sounds. No murmur heard. ?  No friction rub. No gallop.  ?Pulmonary:  ?   Effort: Pulmonary effort is normal. No respiratory distress.  ?   Breath sounds: Normal breath sounds. No  stridor. No wheezing, rhonchi or rales.  ?Abdominal:  ?   General: Bowel sounds are normal. There is no distension.  ?   Palpations: Abdomen is soft. There is no mass.  ?   Tenderness: There is no abdominal tenderness. There is no guarding.  ?Musculoskeletal:  ?   Lumbar back: Normal. No spasms, tenderness or bony tenderness. Negative right straight leg raise test and negative left straight leg raise test.  ?   Right lower leg: No edema.  ?   Left lower leg: No edema.  ?Lymphadenopathy:  ?   Cervical: No cervical adenopathy.  ?Neurological:  ?   General: No focal deficit present.  ?   Mental Status: He is alert and oriented to person, place, and time.  ?   Cranial Nerves: No cranial nerve deficit.  ?   Motor: No weakness.  ?   Gait: Gait normal.  ? ? ? ? ? ?   ?Assessment & Plan:  ?Primary  hypertension - Plan: CBC with Differential/Platelet, Lipid panel, COMPLETE METABOLIC PANEL WITH GFR ? ?Prostate cancer screening - Plan: PSA ?Patient has very strong opinions about medication and vaccinations.  I tried to explain the rationale behind vaccines but at the present time he declines Prevnar 20 for the shingles vaccine.  He also declines colonoscopy.  He is willing to let me check a PSA, CBC, lipid panel and, CMP.  I am very happy with his blood pressure.  He declines any future treatment for his cholesterol.  He does complain of some left hip pain.  I discussed with him NSAIDs but he declines any NSAIDs at the present time.  I will be glad to try to help in any way I can in the future but at the present time he prefers minimal medical intervention. ?

## 2021-09-07 LAB — LIPID PANEL
Cholesterol: 262 mg/dL — ABNORMAL HIGH (ref <200)
HDL: 46 mg/dL (ref >=40)
LDL Cholesterol (Calc): 189 mg/dL (calc) — ABNORMAL HIGH
Non-HDL Cholesterol (Calc): 216 mg/dL (calc) — ABNORMAL HIGH (ref <130)
Total CHOL/HDL Ratio: 5.7 (calc) — ABNORMAL HIGH (ref <5.0)
Triglycerides: 131 mg/dL (ref <150)

## 2021-09-07 LAB — CBC WITH DIFFERENTIAL/PLATELET
Absolute Monocytes: 377 cells/uL (ref 200–950)
Basophils Absolute: 41 cells/uL (ref 0–200)
Basophils Relative: 0.8 %
Eosinophils Absolute: 92 cells/uL (ref 15–500)
Eosinophils Relative: 1.8 %
HCT: 46.6 % (ref 38.5–50.0)
Hemoglobin: 15.8 g/dL (ref 13.2–17.1)
Lymphs Abs: 1851 cells/uL (ref 850–3900)
MCH: 29.7 pg (ref 27.0–33.0)
MCHC: 33.9 g/dL (ref 32.0–36.0)
MCV: 87.6 fL (ref 80.0–100.0)
MPV: 12 fL (ref 7.5–12.5)
Monocytes Relative: 7.4 %
Neutro Abs: 2739 cells/uL (ref 1500–7800)
Neutrophils Relative %: 53.7 %
Platelets: 216 10*3/uL (ref 140–400)
RBC: 5.32 10*6/uL (ref 4.20–5.80)
RDW: 12 % (ref 11.0–15.0)
Total Lymphocyte: 36.3 %
WBC: 5.1 10*3/uL (ref 3.8–10.8)

## 2021-09-07 LAB — COMPLETE METABOLIC PANEL WITH GFR
AG Ratio: 1.7 (calc) (ref 1.0–2.5)
ALT: 15 U/L (ref 9–46)
AST: 15 U/L (ref 10–35)
Albumin: 4.3 g/dL (ref 3.6–5.1)
Alkaline phosphatase (APISO): 57 U/L (ref 35–144)
BUN: 11 mg/dL (ref 7–25)
CO2: 25 mmol/L (ref 20–32)
Calcium: 9.5 mg/dL (ref 8.6–10.3)
Chloride: 104 mmol/L (ref 98–110)
Creat: 1.01 mg/dL (ref 0.70–1.28)
Globulin: 2.5 g/dL (calc) (ref 1.9–3.7)
Glucose, Bld: 91 mg/dL (ref 65–99)
Potassium: 4.7 mmol/L (ref 3.5–5.3)
Sodium: 141 mmol/L (ref 135–146)
Total Bilirubin: 0.7 mg/dL (ref 0.2–1.2)
Total Protein: 6.8 g/dL (ref 6.1–8.1)
eGFR: 77 mL/min/{1.73_m2} (ref >=60)

## 2021-09-07 LAB — PSA: PSA: 0.26 ng/mL (ref <=4.00)

## 2022-06-02 ENCOUNTER — Telehealth: Payer: Self-pay | Admitting: Family Medicine

## 2022-06-02 NOTE — Telephone Encounter (Signed)
Left message for patient to call back and schedule Medicare Annual Wellness Visit (AWV) in office.   If not able to come in office, please offer to do virtually or by telephone.   No history of  AWV: Per [employer] eligible for AWVI as of  08/18/2011  Please schedule at anytime with [facility] Health Advisor Toni Amend  If any questions, please contact me at [phone]

## 2022-08-03 ENCOUNTER — Ambulatory Visit (INDEPENDENT_AMBULATORY_CARE_PROVIDER_SITE_OTHER): Payer: Medicare HMO

## 2022-08-03 VITALS — BP 128/74 | Ht 69.0 in | Wt 189.4 lb

## 2022-08-03 DIAGNOSIS — Z0000 Encounter for general adult medical examination without abnormal findings: Secondary | ICD-10-CM | POA: Diagnosis not present

## 2022-08-03 NOTE — Progress Notes (Signed)
Subjective:   Daniel Dedrick. is a 77 y.o. male who presents for an Initial Medicare Annual Wellness Visit.  Review of Systems     Cardiac Risk Factors include: advanced age (>78mn, >>61women);hypertension;male gender;dyslipidemia     Objective:    Today's Vitals   08/03/22 1020  BP: 128/74  Weight: 189 lb 6.4 oz (85.9 kg)  Height: 5' 9"$  (1.753 m)   Body mass index is 27.97 kg/m.     08/03/2022   10:51 AM  Advanced Directives  Does Patient Have a Medical Advance Directive? Yes  Type of Advance Directive Living will  Does patient want to make changes to medical advance directive? No - Patient declined    Current Medications (verified) Outpatient Encounter Medications as of 08/03/2022  Medication Sig   benazepril-hydrochlorthiazide (LOTENSIN HCT) 10-12.5 MG tablet TAKE 1 TABLET BY MOUTH DAILY   ezetimibe (ZETIA) 10 MG tablet TAKE 1 TABLET(10 MG) BY MOUTH DAILY (Patient not taking: Reported on 09/06/2021)   fenofibrate 160 MG tablet Take 1 tablet (160 mg total) by mouth daily. (Patient not taking: Reported on 09/06/2021)   fenofibrate 160 MG tablet Take 1 tablet (160 mg total) by mouth daily. (Patient not taking: Reported on 06/17/2019)   No facility-administered encounter medications on file as of 08/03/2022.    Allergies (verified) Statins and Zetia [ezetimibe]   History: Past Medical History:  Diagnosis Date   Hyperlipidemia    Hypertension    No past surgical history on file. Family History  Problem Relation Age of Onset   Diabetes Mother    Stroke Mother    Cancer Father        Lung   Diabetes Brother    Stroke Brother    Social History   Socioeconomic History   Marital status: Unknown    Spouse name: Not on file   Number of children: Not on file   Years of education: Not on file   Highest education level: Not on file  Occupational History   Not on file  Tobacco Use   Smoking status: Never   Smokeless tobacco: Never  Substance and Sexual  Activity   Alcohol use: No   Drug use: No   Sexual activity: Not on file  Other Topics Concern   Not on file  Social History Narrative   Not on file   Social Determinants of Health   Financial Resource Strain: Low Risk  (08/03/2022)   Overall Financial Resource Strain (CARDIA)    Difficulty of Paying Living Expenses: Not hard at all  Food Insecurity: No Food Insecurity (08/03/2022)   Hunger Vital Sign    Worried About Running Out of Food in the Last Year: Never true    [place] the Last [place]: Never true  Transportation Needs: No Transportation Needs (08/03/2022)   PRAPARE - THydrologist(Medical): No    Lack of Transportation (Non-Medical): No  Physical Activity: Inactive (08/03/2022)   Exercise Vital Sign    Days of Exercise per Week: 0 days    Minutes of Exercise per Session: 0 min  Stress: No Stress Concern Present (08/03/2022)   FC[place]   Feeling of Stress : Not at all  Social Connections: [place](08/03/2022)   Social Connection and Isolation Panel [NHANES]    Frequency of Communication with Friends and Family: More than three times a week    Frequency of  Social Gatherings with Friends and Family: Three times a week    Attends Religious Services: More than 4 times per year    Active Member of Clubs or Organizations: Yes    Attends Music therapist: More than 4 times per year    Marital Status: Married    Tobacco Counseling Counseling given: Not Answered   Clinical Intake:  Pre-visit preparation completed: Yes  Pain : No/denies pain  Diabetes: No  How often do you need to have someone help you when you read instructions, pamphlets, or other written materials from your doctor or pharmacy?: 1 - Never  Diabetic?No   Interpreter Needed?: No  Information entered by :: Daniel George LPN   Activities of Daily Living    08/03/2022    10:51 AM  In your present state of health, do you have any difficulty performing the following activities:  Hearing? 0  Vision? 0  Difficulty concentrating or making decisions? 0  Walking or climbing stairs? 0  Dressing or bathing? 0  Doing errands, shopping? 0  Preparing Food and eating ? N  Using the Toilet? N  In the past six months, have you accidently leaked urine? N  Do you have problems with loss of bowel control? N  Managing your Medications? N  Managing your Finances? N  Housekeeping or managing your Housekeeping? N    Patient Care Team: Susy Frizzle, MD as PCP - General (Family Medicine)  Indicate any recent Medical Services you may have received from other than [facility] providers in the past year (date may be approximate).     Assessment:   This is a routine wellness examination for [facility].  Hearing/Vision screen Hearing Screening - Comments:: Denies hearing difficulties  Vision Screening - Comments:: No vision problems  Dietary issues and exercise activities discussed: Current Exercise Habits: The patient does not participate in regular exercise at present   Goals Addressed             This Visit's Progress    Maintain health and remain active        Depression Screen    08/03/2022   10:50 AM 03/10/2014    9:24 AM  PHQ 2/9 Scores  PHQ - 2 Score 0 0    Fall Risk    08/03/2022   10:26 AM 03/10/2014    9:24 AM  [place] in the past year? 0 No  Number falls in past yr: 0   Injury with Fall? 0   Follow up Falls prevention discussed;Education provided;Falls evaluation completed     FALL RISK PREVENTION PERTAINING TO THE HOME:  Any stairs in or around the home? Yes  If so, are there any without handrails? No  Home free of loose throw rugs in walkways, pet beds, electrical cords, etc? Yes  Adequate lighting in your home to reduce risk of falls? Yes   ASSISTIVE DEVICES UTILIZED TO PREVENT FALLS:  Life alert? No  Use of a cane, walker or  w/c? No  Grab bars in the bathroom? Yes  Shower chair or bench in shower? No  Elevated toilet seat or a handicapped toilet? Yes   TIMED UP AND GO:  Was the test performed? Yes .  Length of time to ambulate 10 feet: 6 sec.   Gait steady and fast without use of assistive device  Cognitive Function:        08/03/2022   10:51 AM  6CIT Screen  What Year? 0 points  What month? 0 points  What time? 0 points  Count back from 20 0 points  Months in reverse 0 points  Repeat phrase 0 points  Total Score 0 points    Immunizations Immunization History  Administered Date(s) Administered   Pneumococcal Polysaccharide-23 02/12/2012    TDAP status: Due, Education has been provided regarding the importance of this vaccine. Advised may receive this vaccine at local pharmacy or Health Dept. Aware to provide a copy of the vaccination record if obtained from local pharmacy or Health Dept. Verbalized acceptance and understanding.  Flu Vaccine status: Declined, Education has been provided regarding the importance of this vaccine but patient still declined. Advised may receive this vaccine at local pharmacy or Health Dept. Aware to provide a copy of the vaccination record if obtained from local pharmacy or Health Dept. Verbalized acceptance and understanding.  Pneumococcal vaccine status: Declined,  Education has been provided regarding the importance of this vaccine but patient still declined. Advised may receive this vaccine at local pharmacy or Health Dept. Aware to provide a copy of the vaccination record if obtained from local pharmacy or Health Dept. Verbalized acceptance and understanding.   Covid-19 vaccine status: Declined, Education has been provided regarding the importance of this vaccine but patient still declined. Advised may receive this vaccine at local pharmacy or Health Dept.or vaccine clinic. Aware to provide a copy of the vaccination record if obtained from local pharmacy or Health  Dept. Verbalized acceptance and understanding.  Qualifies for Shingles Vaccine? Yes   Zostavax completed No   Shingrix Completed?: No.    Education has been provided regarding the importance of this vaccine. Patient has been advised to call insurance company to determine out of pocket expense if they have not yet received this vaccine. Advised may also receive vaccine at local pharmacy or Health Dept. Verbalized acceptance and understanding.  Screening Tests Health Maintenance  Topic Date Due   Hepatitis C Screening  Never done   COVID-19 Vaccine (1) 08/19/2022 (Originally 02/24/1946)   INFLUENZA VACCINE  09/17/2022 (Originally 01/17/2022)   Zoster Vaccines- Shingrix (1 of 2) 11/01/2022 (Originally 08/25/1995)   Pneumonia Vaccine 20+ Years old (2 of 2 - PCV) 08/04/2023 (Originally 02/11/2013)   Medicare Annual Wellness (AWV)  08/04/2023   HPV VACCINES  Aged Out   DTaP/Tdap/Td  Discontinued   Fecal DNA (Cologuard)  Discontinued    Health Maintenance  Health Maintenance Due  Topic Date Due   Hepatitis C Screening  Never done    Colorectal cancer screening: No longer required.   Lung Cancer Screening: (Low Dose CT Chest recommended if Age 17-80 years, 30 pack-year currently smoking OR have quit w/in 15years.) does not qualify.   Lung Cancer Screening Referral: n/a   Additional Screening:  Hepatitis C Screening: does qualify; Completed at next office visit   Vision Screening: Recommended annual ophthalmology exams for early detection of glaucoma and other disorders of the eye. Is the patient up to date with their annual eye exam?  No  Who is the provider or what is the name of the office in which the patient attends annual eye exams? none If pt is not established with a provider, would they like to be referred to a provider to establish care? No .   Dental Screening: Recommended annual dental exams for proper oral hygiene  Community Resource Referral / Chronic Care Management: CRR  required this visit?  No   CCM required this visit?  No      Plan:  I have personally reviewed and noted the following in the patient's chart:   Medical and social history Use of alcohol, tobacco or illicit drugs  Current medications and supplements including opioid prescriptions. Patient is not currently taking opioid prescriptions. Functional ability and status Nutritional status Physical activity Advanced directives List of other physicians Hospitalizations, surgeries, and ER visits in previous 12 months Vitals Screenings to include cognitive, depression, and falls Referrals and appointments  In addition, I have reviewed and discussed with patient certain preventive protocols, quality metrics, and best practice recommendations. A written personalized care plan for preventive services as well as general preventive health recommendations were provided to patient.     Daniel Gallagher, [place]   [id number]   Nurse Notes: No concerns

## 2022-08-03 NOTE — Patient Instructions (Addendum)
Daniel Gallagher , Thank you for taking time to come for your Medicare Wellness Visit. I appreciate your ongoing commitment to your health goals. Please review the following plan we discussed and let me know if I can assist you in the future.   These are the goals we discussed:  Goals   None     This is a list of the screening recommended for you and due dates:  Health Maintenance  Topic Date Due   Medicare Annual Wellness Visit  Never done   COVID-19 Vaccine (1) Never done   Hepatitis C Screening: USPSTF Recommendation to screen - Ages 58-79 yo.  Never done   DTaP/Tdap/Td vaccine (1 - Tdap) Never done   Zoster (Shingles) Vaccine (1 of 2) Never done   Pneumonia Vaccine (2 of 2 - PCV) 02/11/2013   Flu Shot  Never done   HPV Vaccine  Aged Out   Cologuard (Stool DNA test)  Discontinued    Advanced directives: Please bring a copy of your health care power of attorney and living will to the office to be added to your chart at your convenience.   Conditions/risks identified: Aim for 30 minutes of exercise or brisk walking, 6-8 glasses of water, and 5 servings of fruits and vegetables each day.   Next appointment: Follow up in one year for your annual wellness visit.   Preventive Care 22 Years and Older, Male  Preventive care refers to lifestyle choices and visits with your health care provider that can promote health and wellness. What does preventive care include? A yearly physical exam. This is also called an annual well check. Dental exams once or twice a year. Routine eye exams. Ask your health care provider how often you should have your eyes checked. Personal lifestyle choices, including: Daily care of your teeth and gums. Regular physical activity. Eating a healthy diet. Avoiding tobacco and drug use. Limiting alcohol use. Practicing safe sex. Taking low doses of aspirin every day. Taking vitamin and mineral supplements as recommended by your health care provider. What happens  during an annual well check? The services and screenings done by your health care provider during your annual well check will depend on your age, overall health, lifestyle risk factors, and family history of disease. Counseling  Your health care provider may ask you questions about your: Alcohol use. Tobacco use. Drug use. Emotional well-being. Home and relationship well-being. Sexual activity. Eating habits. History of falls. Memory and ability to understand (cognition). Work and work Statistician. Screening  You may have the following tests or measurements: Height, weight, and BMI. Blood pressure. Lipid and cholesterol levels. These may be checked every 5 years, or more frequently if you are over 38 years old. Skin check. Lung cancer screening. You may have this screening every year starting at age 84 if you have a 30-pack-year history of smoking and currently smoke or have quit within the past 15 years. Fecal occult blood test (FOBT) of the stool. You may have this test every year starting at age 8. Flexible sigmoidoscopy or colonoscopy. You may have a sigmoidoscopy every 5 years or a colonoscopy every 10 years starting at age 44. Prostate cancer screening. Recommendations will vary depending on your family history and other risks. Hepatitis C blood test. Hepatitis B blood test. Sexually transmitted disease (STD) testing. Diabetes screening. This is done by checking your blood sugar (glucose) after you have not eaten for a while (fasting). You may have this done every 1-3 years. Abdominal aortic  aneurysm (AAA) screening. You may need this if you are a current or former smoker. Osteoporosis. You may be screened starting at age 79 if you are at high risk. Talk with your health care provider about your test results, treatment options, and if necessary, the need for more tests. Vaccines  Your health care provider may recommend certain vaccines, such as: Influenza vaccine. This is  recommended every year. Tetanus, diphtheria, and acellular pertussis (Tdap, Td) vaccine. You may need a Td booster every 10 years. Zoster vaccine. You may need this after age 58. Pneumococcal 13-valent conjugate (PCV13) vaccine. One dose is recommended after age 24. Pneumococcal polysaccharide (PPSV23) vaccine. One dose is recommended after age 82. Talk to your health care provider about which screenings and vaccines you need and how often you need them. This information is not intended to replace advice given to you by your health care provider. Make sure you discuss any questions you have with your health care provider. Document Released: 07/02/2015 Document Revised: 02/23/2016 Document Reviewed: 04/06/2015 Elsevier Interactive Patient Education  2017 [place] Prevention in the Home Falls can cause injuries. They can happen to people of all ages. There are many things you can do to make your home safe and to help prevent falls. What can I do on the outside of my home? Regularly fix the edges of walkways and driveways and fix any cracks. Remove anything that might make you trip as you walk through a door, such as a raised step or threshold. Trim any bushes or trees on the path to your home. Use bright outdoor lighting. Clear any walking paths of anything that might make someone trip, such as rocks or tools. Regularly check to see if handrails are loose or broken. Make sure that both sides of any steps have handrails. Any raised decks and porches should have guardrails on the edges. Have any leaves, snow, or ice cleared regularly. Use sand or salt on walking paths during winter. Clean up any spills in your garage right away. This includes oil or grease spills. What can I do in the bathroom? Use night lights. Install grab bars by the toilet and in the tub and shower. Do not use towel bars as grab bars. Use non-skid mats or decals in the tub or shower. If you need to sit down in  the shower, use a plastic, non-slip stool. Keep the floor dry. Clean up any water that spills on the floor as soon as it happens. Remove soap buildup in the tub or shower regularly. Attach bath mats securely with double-sided non-slip rug tape. Do not have throw rugs and other things on the floor that can make you trip. What can I do in the bedroom? Use night lights. Make sure that you have a light by your bed that is easy to reach. Do not use any sheets or blankets that are too big for your bed. They should not hang down onto the floor. Have a firm chair that has side arms. You can use this for support while you get dressed. Do not have throw rugs and other things on the floor that can make you trip. What can I do in the kitchen? Clean up any spills right away. Avoid walking on wet floors. Keep items that you use a lot in easy-to-reach places. If you need to reach something above you, use a strong step stool that has a grab bar. Keep electrical cords out of the way. Do not use floor  polish or wax that makes floors slippery. If you must use wax, use non-skid floor wax. Do not have throw rugs and other things on the floor that can make you trip. What can I do with my stairs? Do not leave any items on the stairs. Make sure that there are handrails on both sides of the stairs and use them. Fix handrails that are broken or loose. Make sure that handrails are as long as the stairways. Check any carpeting to make sure that it is firmly attached to the stairs. Fix any carpet that is loose or worn. Avoid having throw rugs at the top or bottom of the stairs. If you do have throw rugs, attach them to the floor with carpet tape. Make sure that you have a light switch at the top of the stairs and the bottom of the stairs. If you do not have them, ask someone to add them for you. What else can I do to help prevent falls? Wear shoes that: Do not have high heels. Have rubber bottoms. Are comfortable  and fit you well. Are closed at the toe. Do not wear sandals. If you use a stepladder: Make sure that it is fully opened. Do not climb a closed stepladder. Make sure that both sides of the stepladder are locked into place. Ask someone to hold it for you, if possible. Clearly mark and make sure that you can see: Any grab bars or handrails. First and last steps. Where the edge of each step is. Use tools that help you move around (mobility aids) if they are needed. These include: Canes. Walkers. Scooters. Crutches. Turn on the lights when you go into a dark area. Replace any light bulbs as soon as they burn out. Set up your furniture so you have a clear path. Avoid moving your furniture around. If any of your floors are uneven, fix them. If there are any pets around you, be aware of where they are. Review your medicines with your doctor. Some medicines can make you feel dizzy. This can increase your chance of falling. Ask your doctor what other things that you can do to help prevent falls. This information is not intended to replace advice given to you by your health care provider. Make sure you discuss any questions you have with your health care provider. Document Released: 04/01/2009 Document Revised: 11/11/2015 Document Reviewed: 07/10/2014 Elsevier Interactive Patient Education  2017 [place]

## 2022-08-20 ENCOUNTER — Other Ambulatory Visit: Payer: Self-pay | Admitting: Family Medicine

## 2023-08-09 ENCOUNTER — Other Ambulatory Visit: Payer: Self-pay | Admitting: Family Medicine

## 2023-10-04 ENCOUNTER — Ambulatory Visit: Payer: Medicare HMO

## 2023-10-04 DIAGNOSIS — Z0000 Encounter for general adult medical examination without abnormal findings: Secondary | ICD-10-CM

## 2023-10-04 NOTE — Patient Instructions (Signed)
 Daniel Gallagher , Thank you for taking time to come for your Medicare Wellness Visit. I appreciate your ongoing commitment to your health goals. Please review the following plan we discussed and let me know if I can assist you in the future.   Screening recommendations/referrals: Colonoscopy: no longer required Recommended yearly ophthalmology/optometry visit for glaucoma screening and checkup Recommended yearly dental visit for hygiene and checkup  Vaccinations: Influenza vaccine: up to date Pneumococcal vaccine: Education provided Tdap vaccine: education provided Shingles vaccine: Education provided    Preventive Care 65 Years and Older, Male Preventive care refers to lifestyle choices and visits with your health care provider that can promote health and wellness. What does preventive care include? A yearly physical exam. This is also called an annual well check. Dental exams once or twice a year. Routine eye exams. Ask your health care provider how often you should have your eyes checked. Personal lifestyle choices, including: Daily care of your teeth and gums. Regular physical activity. Eating a healthy diet. Avoiding tobacco and drug use. Limiting alcohol use. Practicing safe sex. Taking low doses of aspirin every day. Taking vitamin and mineral supplements as recommended by your health care provider. What happens during an annual well check? The services and screenings done by your health care provider during your annual well check will depend on your age, overall health, lifestyle risk factors, and family history of disease. Counseling  Your health care provider may ask you questions about your: Alcohol use. Tobacco use. Drug use. Emotional well-being. Home and relationship well-being. Sexual activity. Eating habits. History of falls. Memory and ability to understand (cognition). Work and work Astronomer. Screening  You may have the following tests or  measurements: Height, weight, and BMI. Blood pressure. Lipid and cholesterol levels. These may be checked every 5 years, or more frequently if you are over 47 years old. Skin check. Lung cancer screening. You may have this screening every year starting at age 8 if you have a 30-pack-year history of smoking and currently smoke or have quit within the past 15 years. Fecal occult blood test (FOBT) of the stool. You may have this test every year starting at age 52. Flexible sigmoidoscopy or colonoscopy. You may have a sigmoidoscopy every 5 years or a colonoscopy every 10 years starting at age 52. Prostate cancer screening. Recommendations will vary depending on your family history and other risks. Hepatitis C blood test. Hepatitis B blood test. Sexually transmitted disease (STD) testing. Diabetes screening. This is done by checking your blood sugar (glucose) after you have not eaten for a while (fasting). You may have this done every 1-3 years. Abdominal aortic aneurysm (AAA) screening. You may need this if you are a current or former smoker. Osteoporosis. You may be screened starting at age 53 if you are at high risk. Talk with your health care provider about your test results, treatment options, and if necessary, the need for more tests. Vaccines  Your health care provider may recommend certain vaccines, such as: Influenza vaccine. This is recommended every year. Tetanus, diphtheria, and acellular pertussis (Tdap, Td) vaccine. You may need a Td booster every 10 years. Zoster vaccine. You may need this after age 57. Pneumococcal 13-valent conjugate (PCV13) vaccine. One dose is recommended after age 8. Pneumococcal polysaccharide (PPSV23) vaccine. One dose is recommended after age 63. Talk to your health care provider about which screenings and vaccines you need and how often you need them. This information is not intended to replace advice given to  you by your health care provider. Make sure  you discuss any questions you have with your health care provider. Document Released: 07/02/2015 Document Revised: 02/23/2016 Document Reviewed: 04/06/2015 Elsevier Interactive Patient Education  2017 [employer].  Fall Prevention in the Home Falls can cause injuries. They can happen to people of all ages. There are many things you can do to make your home safe and to help prevent falls. What can I do on the outside of my home? Regularly fix the edges of walkways and driveways and fix any cracks. Remove anything that might make you trip as you walk through a door, such as a raised step or threshold. Trim any bushes or trees on the path to your home. Use bright outdoor lighting. Clear any walking paths of anything that might make someone trip, such as rocks or tools. Regularly check to see if handrails are loose or broken. Make sure that both sides of any steps have handrails. Any raised decks and porches should have guardrails on the edges. Have any leaves, snow, or ice cleared regularly. Use sand or salt on walking paths during winter. Clean up any spills in your garage right away. This includes oil or grease spills. What can I do in the bathroom? Use night lights. Install grab bars by the toilet and in the tub and shower. Do not use towel bars as grab bars. Use non-skid mats or decals in the tub or shower. If you need to sit down in the shower, use a plastic, non-slip stool. Keep the floor dry. Clean up any water that spills on the floor as soon as it happens. Remove soap buildup in the tub or shower regularly. Attach bath mats securely with double-sided non-slip rug tape. Do not have throw rugs and other things on the floor that can make you trip. What can I do in the bedroom? Use night lights. Make sure that you have a light by your bed that is easy to reach. Do not use any sheets or blankets that are too big for your bed. They should not hang down onto the floor. Have a firm  chair that has side arms. You can use this for support while you get dressed. Do not have throw rugs and other things on the floor that can make you trip. What can I do in the kitchen? Clean up any spills right away. Avoid walking on wet floors. Keep items that you use a lot in easy-to-reach places. If you need to reach something above you, use a strong step stool that has a grab bar. Keep electrical cords out of the way. Do not use floor polish or wax that makes floors slippery. If you must use wax, use non-skid floor wax. Do not have throw rugs and other things on the floor that can make you trip. What can I do with my stairs? Do not leave any items on the stairs. Make sure that there are handrails on both sides of the stairs and use them. Fix handrails that are broken or loose. Make sure that handrails are as long as the stairways. Check any carpeting to make sure that it is firmly attached to the stairs. Fix any carpet that is loose or worn. Avoid having throw rugs at the top or bottom of the stairs. If you do have throw rugs, attach them to the floor with carpet tape. Make sure that you have a light switch at the top of the stairs and the bottom of the stairs.  If you do not have them, ask someone to add them for you. What else can I do to help prevent falls? Wear shoes that: Do not have high heels. Have rubber bottoms. Are comfortable and fit you well. Are closed at the toe. Do not wear sandals. If you use a stepladder: Make sure that it is fully opened. Do not climb a closed stepladder. Make sure that both sides of the stepladder are locked into place. Ask someone to hold it for you, if possible. Clearly mark and make sure that you can see: Any grab bars or handrails. First and last steps. Where the edge of each step is. Use tools that help you move around (mobility aids) if they are needed. These include: Canes. Walkers. Scooters. Crutches. Turn on the lights when you go  into a dark area. Replace any light bulbs as soon as they burn out. Set up your furniture so you have a clear path. Avoid moving your furniture around. If any of your floors are uneven, fix them. If there are any pets around you, be aware of where they are. Review your medicines with your doctor. Some medicines can make you feel dizzy. This can increase your chance of falling. Ask your doctor what other things that you can do to help prevent falls. This information is not intended to replace advice given to you by your health care provider. Make sure you discuss any questions you have with your health care provider. Document Released: 04/01/2009 Document Revised: 11/11/2015 Document Reviewed: 07/10/2014 Elsevier Interactive Patient Education  2017 [employer].

## 2023-10-04 NOTE — Progress Notes (Unsigned)
 Subjective:   Daniel Tabb. is a 78 y.o. male who presents for Medicare Annual/Subsequent preventive examination.  Visit Complete: Virtual I connected with  Rudine Cos. on 10/04/23 by a audio enabled telemedicine application and verified that I am speaking with the correct person using two identifiers.  Patient Location: Home  Provider Location: Home Office  I discussed the limitations of evaluation and management by telemedicine. The patient expressed understanding and agreed to proceed.  Vital Signs: Because this visit was a virtual/telehealth visit, some criteria may be missing or patient reported. Any vitals not documented were not able to be obtained and vitals that have been documented are patient reported.         Objective:    There were no vitals filed for this visit. There is no height or weight on file to calculate BMI.     10/04/2023    4:01 PM 08/03/2022   10:51 AM  Advanced Directives  Does Patient Have a Medical Advance Directive? Yes Yes  Type of Advance Directive Healthcare Power of Attorney Living will  Does patient want to make changes to medical advance directive?  No - Patient declined  Copy of Healthcare Power of Attorney in Chart? No - copy requested     Current Medications (verified) Outpatient Encounter Medications as of 10/04/2023  Medication Sig   benazepril-hydrochlorthiazide (LOTENSIN HCT) 10-12.5 MG tablet TAKE 1 TABLET BY MOUTH DAILY   ezetimibe (ZETIA) 10 MG tablet TAKE 1 TABLET(10 MG) BY MOUTH DAILY (Patient not taking: Reported on 10/04/2023)   fenofibrate 160 MG tablet Take 1 tablet (160 mg total) by mouth daily. (Patient not taking: Reported on 10/04/2023)   fenofibrate 160 MG tablet Take 1 tablet (160 mg total) by mouth daily. (Patient not taking: Reported on 10/04/2023)   No facility-administered encounter medications on file as of 10/04/2023.    Allergies (verified) Statins and Zetia [ezetimibe]   History: Past Medical  History:  Diagnosis Date   Hyperlipidemia    Hypertension    History reviewed. No pertinent surgical history. Family History  Problem Relation Age of Onset   Diabetes Mother    Stroke Mother    Cancer Father        Lung   Diabetes Brother    Stroke Brother    Social History   Socioeconomic History   Marital status: Unknown    Spouse name: Not on file   Number of children: Not on file   Years of education: Not on file   Highest education level: Not on file  Occupational History   Not on file  Tobacco Use   Smoking status: Never   Smokeless tobacco: Never  Substance and Sexual Activity   Alcohol use: No   Drug use: No   Sexual activity: Not Currently  Other Topics Concern   Not on file  Social History Narrative   Not on file   Social Drivers of Health   Financial Resource Strain: Low Risk  (10/04/2023)   Overall Financial Resource Strain (CARDIA)    Difficulty of Paying Living Expenses: Not hard at all  Food Insecurity: No Food Insecurity (10/04/2023)   Hunger Vital Sign    Worried About Running Out of Food in the Last Year: Never true    Ran Out of Food in the Last Year: Never true  Transportation Needs: No Transportation Needs (10/04/2023)   PRAPARE - Administrator, Civil Service (Medical): No    Lack of Transportation (  Non-Medical): No  Physical Activity: Inactive (10/04/2023)   Exercise Vital Sign    Days of Exercise per Week: 0 days    Minutes of Exercise per Session: 0 min  Stress: No Stress Concern Present (10/04/2023)   [employer] of Occupational Health - Occupational Stress Questionnaire    Feeling of Stress : Not at all  Social Connections: Socially Integrated (10/04/2023)   Social Connection and Isolation Panel [NHANES]    Frequency of Communication with Friends and Family: More than three times a week    Frequency of Social Gatherings with Friends and Family: Three times a week    Attends Religious Services: More than 4 times per  year    Active Member of Clubs or Organizations: Yes    Attends Engineer, structural: More than 4 times per year    Marital Status: Married    Tobacco Counseling Counseling given: Not Answered   Clinical Intake:  Pre-visit preparation completed: Yes  Pain : No/denies pain     Diabetes: No  How often do you need to have someone help you when you read instructions, pamphlets, or other written materials from your doctor or pharmacy?: 1 - Never  Interpreter Needed?: No  Information entered by :: Kieth Pelt LPN   Activities of Daily Living     No data to display          Patient Care Team: Austine Lefort, MD as PCP - General (Family Medicine)  Indicate any recent Medical Services you may have received from other than [facility] providers in the past year (date may be approximate).     Assessment:   This is a routine wellness examination for [name].  Hearing/Vision screen Hearing Screening - Comments:: Has trouble hearing   Does not wear hearing aids Vision Screening - Comments:: Not up to date   Goals Addressed             This Visit's Progress    Patient Stated       Continue current lifestyle       Depression Screen    10/04/2023    4:05 PM 08/03/2022   10:50 AM 03/10/2014    9:24 AM  PHQ 2/9 Scores  PHQ - 2 Score 0 0 0  PHQ- 9 Score 0      Fall Risk    10/04/2023    4:00 PM 08/03/2022   10:26 AM 03/10/2014    9:24 AM  Fall Risk   Falls in the past year? 0 0 No  Number falls in past yr: 0 0   Injury with Fall? 0 0   Follow up Falls evaluation completed;Education provided;Falls prevention discussed Falls prevention discussed;Education provided;Falls evaluation completed     MEDICARE RISK AT HOME: Medicare Risk at Home Any stairs in or around the home?: No If so, are there any without handrails?: No Home free of loose throw rugs in walkways, pet beds, electrical cords, etc?: Yes Adequate lighting in your home to reduce risk of  falls?: Yes Life alert?: No Use of a cane, walker or w/c?: No Grab bars in the bathroom?: Yes Shower chair or bench in shower?: Yes Elevated toilet seat or a handicapped toilet?: No  TIMED UP AND GO:  Was the test performed?  No    Cognitive Function:        10/04/2023    4:03 PM 08/03/2022   10:51 AM  6CIT Screen  What Year? 0 points 0 points  What month? 0  points 0 points  What time? 0 points 0 points  Count back from 20 0 points 0 points  Months in reverse 0 points 0 points  Repeat phrase 0 points 0 points  Total Score 0 points 0 points    Immunizations Immunization History  Administered Date(s) Administered   Pneumococcal Polysaccharide-23 02/12/2012    TDAP status: Due, Education has been provided regarding the importance of this vaccine. Advised may receive this vaccine at local pharmacy or Health Dept. Aware to provide a copy of the vaccination record if obtained from local pharmacy or Health Dept. Verbalized acceptance and understanding.  Flu Vaccine status: Up to date  Pneumococcal vaccine status: Due, Education has been provided regarding the importance of this vaccine. Advised may receive this vaccine at local pharmacy or Health Dept. Aware to provide a copy of the vaccination record if obtained from local pharmacy or Health Dept. Verbalized acceptance and understanding.  Covid-19 vaccine status: Information provided on how to obtain vaccines.   Qualifies for Shingles Vaccine? Yes   Zostavax completed No   Shingrix Completed?: No.    Education has been provided regarding the importance of this vaccine. Patient has been advised to call insurance company to determine out of pocket expense if they have not yet received this vaccine. Advised may also receive vaccine at local pharmacy or Health Dept. Verbalized acceptance and understanding.  Screening Tests Health Maintenance  Topic Date Due   Hepatitis C Screening  Never done   Zoster Vaccines- Shingrix (1 of 2)  Never done   Pneumonia Vaccine 70+ Years old (2 of 2 - PCV) 02/11/2013   COVID-19 Vaccine (1 - 2024-25 season) Never done   INFLUENZA VACCINE  01/18/2024   Medicare Annual Wellness (AWV)  10/03/2024   HPV VACCINES  Aged Out   Meningococcal B Vaccine  Aged Out   DTaP/Tdap/Td  Discontinued   Fecal DNA (Cologuard)  Discontinued    Health Maintenance  Health Maintenance Due  Topic Date Due   Hepatitis C Screening  Never done   Zoster Vaccines- Shingrix (1 of 2) Never done   Pneumonia Vaccine 41+ Years old (2 of 2 - PCV) 02/11/2013   COVID-19 Vaccine (1 - 2024-25 season) Never done    Colorectal cancer screening: No longer required.   Lung Cancer Screening: (Low Dose CT Chest recommended if Age 24-80 years, 20 pack-year currently smoking OR have quit w/in 15years.) does not qualify.   Lung Cancer Screening Referral:   Additional Screening:  Hepatitis C Screening: does not qualify;   Vision Screening: Recommended annual ophthalmology exams for early detection of glaucoma and other disorders of the eye. Is the patient up to date with their annual eye exam?  No  Who is the provider or what is the name of the office in which the patient attends annual eye exams?  If pt is not established with a provider, would they like to be referred to a provider to establish care? No .   Dental Screening: Recommended annual dental exams for proper oral hygiene    Community Resource Referral / Chronic Care Management: CRR required this visit?  No   CCM required this visit?  No     Plan:     I have personally reviewed and noted the following in the patient's chart:   Medical and social history Use of alcohol, tobacco or illicit drugs  Current medications and supplements including opioid prescriptions. Patient is not currently taking opioid prescriptions. Functional ability and status Nutritional  status Physical activity Advanced directives List of other physicians Hospitalizations,  surgeries, and ER visits in previous 12 months Vitals Screenings to include cognitive, depression, and falls Referrals and appointments  In addition, I have reviewed and discussed with patient certain preventive protocols, quality metrics, and best practice recommendations. A written personalized care plan for preventive services as well as general preventive health recommendations were provided to patient.     Kieth Pelt, LPN   [id number]   After Visit Summary: (MyChart) Due to this being a telephonic visit, the after visit summary with patients personalized plan was offered to patient via MyChart   Nurse Notes:

## 2023-10-18 ENCOUNTER — Encounter: Payer: Self-pay | Admitting: Family Medicine

## 2023-10-18 ENCOUNTER — Ambulatory Visit: Admitting: Family Medicine

## 2023-10-18 VITALS — BP 124/82 | HR 63 | Temp 98.0°F | Ht 69.0 in | Wt 188.8 lb

## 2023-10-18 DIAGNOSIS — I499 Cardiac arrhythmia, unspecified: Secondary | ICD-10-CM | POA: Diagnosis not present

## 2023-10-18 DIAGNOSIS — I10 Essential (primary) hypertension: Secondary | ICD-10-CM

## 2023-10-18 DIAGNOSIS — Z0000 Encounter for general adult medical examination without abnormal findings: Secondary | ICD-10-CM | POA: Diagnosis not present

## 2023-10-18 DIAGNOSIS — Z1211 Encounter for screening for malignant neoplasm of colon: Secondary | ICD-10-CM

## 2023-10-18 DIAGNOSIS — Z125 Encounter for screening for malignant neoplasm of prostate: Secondary | ICD-10-CM

## 2023-10-18 DIAGNOSIS — Z0001 Encounter for general adult medical examination with abnormal findings: Secondary | ICD-10-CM | POA: Diagnosis not present

## 2023-10-18 NOTE — Progress Notes (Signed)
 Subjective:    Patient ID: Daniel Cos., male    DOB: 01-23-1946, 78 y.o.   MRN: [id number]  HPI Patient is a very pleasant 78 year old Caucasian gentleman who presents today for complete physical exam.  He refuses all vaccinations.  I recommended the pneumonia vaccine, tetanus vaccine, and shingles vaccine.  He politely declined these.  He is due for colon cancer screening and he agrees to a Cologuard.  He is also due for prostate cancer screening and he agrees to a PSA.  On his physical exam today he has an irregularly irregular heartbeat.  He was not physically aware of this.  Therefore we will obtain an EKG.  Patient does complain of mild hearing loss but declines a referral for hearing aids.  He also has left posterior hip pain for years but declines referral for orthopedics.  Past Medical History:  Diagnosis Date   Hyperlipidemia    Hypertension    Current Outpatient Medications on File Prior to Visit  Medication Sig Dispense Refill   benazepril -hydrochlorthiazide (LOTENSIN  HCT) 10-12.5 MG tablet TAKE 1 TABLET BY MOUTH DAILY 90 tablet 0   ezetimibe  (ZETIA ) 10 MG tablet TAKE 1 TABLET(10 MG) BY MOUTH DAILY (Patient not taking: Reported on 10/04/2023) 90 tablet 1   fenofibrate  160 MG tablet Take 1 tablet (160 mg total) by mouth daily. (Patient not taking: Reported on 10/04/2023) 30 tablet 0   fenofibrate  160 MG tablet Take 1 tablet (160 mg total) by mouth daily. (Patient not taking: Reported on 10/04/2023) 90 tablet 1   No current facility-administered medications on file prior to visit.   Allergies  Allergen Reactions   Statins Other (See Comments)    Severe myalgias   Zetia  [Ezetimibe ] Other (See Comments)    Severe dizziness   Social History   Socioeconomic History   Marital status: Unknown    Spouse name: Not on file   Number of children: Not on file   Years of education: Not on file   Highest education level: Not on file  Occupational History   Not on file  Tobacco Use    Smoking status: Never   Smokeless tobacco: Never  Substance and Sexual Activity   Alcohol use: No   Drug use: No   Sexual activity: Not Currently  Other Topics Concern   Not on file  Social History Narrative   Not on file   Social Drivers of Health   Financial Resource Strain: Low Risk  (10/04/2023)   Overall Financial Resource Strain (CARDIA)    Difficulty of Paying Living Expenses: Not hard at all  Food Insecurity: No Food Insecurity (10/04/2023)   Hunger Vital Sign    Worried About Running Out of Food in the Last Year: Never true    Ran Out of Food in the Last Year: Never true  Transportation Needs: No Transportation Needs (10/04/2023)   PRAPARE - Administrator, Civil Service (Medical): No    Lack of Transportation (Non-Medical): No  Physical Activity: Inactive (10/04/2023)   Exercise Vital Sign    Days of Exercise per Week: 0 days    Minutes of Exercise per Session: 0 min  Stress: No Stress Concern Present (10/04/2023)   [employer] of Occupational Health - Occupational Stress Questionnaire    Feeling of Stress : Not at all  Social Connections: Socially Integrated (10/04/2023)   Social Connection and Isolation Panel [NHANES]    Frequency of Communication with Friends and Family: More than three times a  week    Frequency of Social Gatherings with Friends and Family: Three times a week    Attends Religious Services: More than 4 times per year    Active Member of Clubs or Organizations: Yes    Attends Banker Meetings: More than 4 times per year    Marital Status: Married  Catering manager Violence: Not At Risk (10/04/2023)   Humiliation, Afraid, Rape, and Kick questionnaire    Fear of Current or Ex-Partner: No    Emotionally Abused: No    Physically Abused: No    Sexually Abused: No    Family History  Problem Relation Age of Onset   Diabetes Mother    Stroke Mother    Cancer Father        Lung   Diabetes Brother    Stroke Brother      Review of Systems  All other systems reviewed and are negative.      Objective:   Physical Exam Vitals reviewed.  Constitutional:      General: He is not in acute distress.    Appearance: Normal appearance. He is normal weight. He is not ill-appearing, toxic-appearing or diaphoretic.  HENT:     Head: Normocephalic and atraumatic.     Right Ear: Tympanic membrane and ear canal normal.     Left Ear: Tympanic membrane and ear canal normal.     Nose: Nose normal. No congestion or rhinorrhea.     Mouth/Throat:     Mouth: Mucous membranes are moist.     Pharynx: Oropharynx is clear. No oropharyngeal exudate or posterior oropharyngeal erythema.  Eyes:     General:        Right eye: No discharge.        Left eye: No discharge.     Extraocular Movements: Extraocular movements intact.     Conjunctiva/sclera: Conjunctivae normal.     Pupils: Pupils are equal, round, and reactive to light.  Neck:     Vascular: No carotid bruit.  Cardiovascular:     Rate and Rhythm: Normal rate. Rhythm irregular.     Pulses: Normal pulses.     Heart sounds: Normal heart sounds. No murmur heard.    No friction rub. No gallop.  Pulmonary:     Effort: Pulmonary effort is normal. No respiratory distress.     Breath sounds: Normal breath sounds. No stridor. No wheezing, rhonchi or rales.  Abdominal:     General: Bowel sounds are normal. There is no distension.     Palpations: Abdomen is soft. There is no mass.     Tenderness: There is no abdominal tenderness. There is no guarding or rebound.     Hernia: No hernia is present.  Musculoskeletal:     Cervical back: Normal range of motion. No tenderness.     Lumbar back: Normal. No spasms, tenderness or bony tenderness. Negative right straight leg raise test and negative left straight leg raise test.     Right lower leg: No edema.     Left lower leg: No edema.  Lymphadenopathy:     Cervical: No cervical adenopathy.  Skin:    General: Skin is warm.      Coloration: Skin is not jaundiced.     Findings: No bruising, erythema, lesion or rash.  Neurological:     General: No focal deficit present.     Mental Status: He is alert and oriented to person, place, and time.     Cranial Nerves: No cranial nerve  deficit.     Sensory: No sensory deficit.     Motor: No weakness.     Coordination: Coordination normal.     Gait: Gait normal.     Deep Tendon Reflexes: Reflexes normal.  Psychiatric:        Mood and Affect: Mood normal.        Behavior: Behavior normal.        Thought Content: Thought content normal.        Judgment: Judgment normal.           Assessment & Plan:  General medical exam - Plan: CBC with Differential/Platelet, COMPLETE METABOLIC PANEL WITHOUT GFR, Lipid panel, PSA  Prostate cancer screening - Plan: PSA  Benign essential HTN - Plan: CBC with Differential/Platelet, COMPLETE METABOLIC PANEL WITHOUT GFR, Lipid panel  Colon cancer screening - Plan: Cologuard  Irregular heart beat - Plan: EKG 12-Lead Schedule patient for Cologuard.  Schedule patient for PSA.  Check CBC, CMP, fasting lipid panel.  Goal cholesterol/LDL is less than 100.  Blood pressure is excellent.  Patient refuses the pneumonia vaccine, the shingles vaccine, and a tetanus shot.  EKG today shows normal sinus rhythm.  He does have frequent PVCs and PACs which explain the irregular heart rhythm detected.  Fortunately there is no evidence of atrial fibrillation or ischemia or infarction.

## 2023-10-19 LAB — COMPLETE METABOLIC PANEL WITHOUT GFR
AG Ratio: 1.8 (calc) (ref 1.0–2.5)
ALT: 16 U/L (ref 9–46)
AST: 16 U/L (ref 10–35)
Albumin: 4.2 g/dL (ref 3.6–5.1)
Alkaline phosphatase (APISO): 53 U/L (ref 35–144)
BUN: 10 mg/dL (ref 7–25)
CO2: 28 mmol/L (ref 20–32)
Calcium: 9.2 mg/dL (ref 8.6–10.3)
Chloride: 103 mmol/L (ref 98–110)
Creat: 1.01 mg/dL (ref 0.70–1.28)
Globulin: 2.3 g/dL (ref 1.9–3.7)
Glucose, Bld: 82 mg/dL (ref 65–99)
Potassium: 4 mmol/L (ref 3.5–5.3)
Sodium: 140 mmol/L (ref 135–146)
Total Bilirubin: 0.6 mg/dL (ref 0.2–1.2)
Total Protein: 6.5 g/dL (ref 6.1–8.1)

## 2023-10-19 LAB — CBC WITH DIFFERENTIAL/PLATELET
Absolute Lymphocytes: 1885 {cells}/uL (ref 850–3900)
Absolute Monocytes: 437 {cells}/uL (ref 200–950)
Basophils Absolute: 38 {cells}/uL (ref 0–200)
Basophils Relative: 0.7 %
Eosinophils Absolute: 130 {cells}/uL (ref 15–500)
Eosinophils Relative: 2.4 %
HCT: 44 % (ref 38.5–50.0)
Hemoglobin: 14.8 g/dL (ref 13.2–17.1)
MCH: 29.7 pg (ref 27.0–33.0)
MCHC: 33.6 g/dL (ref 32.0–36.0)
MCV: 88.4 fL (ref 80.0–100.0)
MPV: 12.6 fL — ABNORMAL HIGH (ref 7.5–12.5)
Monocytes Relative: 8.1 %
Neutro Abs: 2911 {cells}/uL (ref 1500–7800)
Neutrophils Relative %: 53.9 %
Platelets: 212 10*3/uL (ref 140–400)
RBC: 4.98 10*6/uL (ref 4.20–5.80)
RDW: 12.1 % (ref 11.0–15.0)
Total Lymphocyte: 34.9 %
WBC: 5.4 10*3/uL (ref 3.8–10.8)

## 2023-10-19 LAB — LIPID PANEL
Cholesterol: 240 mg/dL — ABNORMAL HIGH (ref <200)
HDL: 43 mg/dL (ref >=40)
LDL Cholesterol (Calc): 163 mg/dL — ABNORMAL HIGH
Non-HDL Cholesterol (Calc): 197 mg/dL — ABNORMAL HIGH (ref <130)
Total CHOL/HDL Ratio: 5.6 (calc) — ABNORMAL HIGH (ref <5.0)
Triglycerides: 188 mg/dL — ABNORMAL HIGH (ref <150)

## 2023-10-19 LAB — PSA: PSA: 0.31 ng/mL (ref <=4.00)

## 2023-10-29 DIAGNOSIS — Z1211 Encounter for screening for malignant neoplasm of colon: Secondary | ICD-10-CM | POA: Diagnosis not present

## 2023-11-03 LAB — COLOGUARD: COLOGUARD: NEGATIVE

## 2023-11-05 ENCOUNTER — Ambulatory Visit: Payer: Self-pay | Admitting: Family Medicine

## 2023-11-14 ENCOUNTER — Other Ambulatory Visit: Payer: Self-pay | Admitting: Family Medicine

## 2024-02-13 ENCOUNTER — Other Ambulatory Visit: Payer: Self-pay | Admitting: Family Medicine

## 2024-02-15 NOTE — Telephone Encounter (Signed)
 Requested medications are due for refill today.  yes  Requested medications are on the active medications list.  yes  Last refill. 11/14/2023 #90 0 rf  Future visit scheduled.   yes  Notes to clinic.  GFR lab is expired.    Requested Prescriptions  Pending Prescriptions Disp Refills   benazepril -hydrochlorthiazide (LOTENSIN  HCT) 10-12.5 MG tablet [Pharmacy Med Name: BENAZEPRIL /HCTZ 10/12.5MG  TABLETS] 90 tablet 0    Sig: TAKE 1 TABLET BY MOUTH DAILY.     Cardiovascular:  ACEI + Diuretic Combos Failed - 02/15/2024  9:57 AM      Failed - eGFR is 30 or above and within 180 days    GFR, Est African American  Date Value Ref Range Status  06/17/2019 95 > OR = 60 mL/min/1.79m2 Final   GFR, Est Non African American  Date Value Ref Range Status  06/17/2019 82 > OR = 60 mL/min/1.62m2 Final   eGFR  Date Value Ref Range Status  09/06/2021 77 > OR = 60 mL/min/1.18m2 Final    Comment:    The eGFR is based on the CKD-EPI 2021 equation. To calculate  the new eGFR from a previous Creatinine or Cystatin C result, go to https://www.kidney.org/professionals/ kdoqi/gfr%5Fcalculator          Passed - Na in normal range and within 180 days    Sodium  Date Value Ref Range Status  10/18/2023 140 135 - 146 mmol/L Final         Passed - K in normal range and within 180 days    Potassium  Date Value Ref Range Status  10/18/2023 4.0 3.5 - 5.3 mmol/L Final         Passed - Cr in normal range and within 180 days    Creat  Date Value Ref Range Status  10/18/2023 1.01 0.70 - 1.28 mg/dL Final         Passed - Patient is not pregnant      Passed - Last BP in normal range    BP Readings from Last 1 Encounters:  10/18/23 124/82         Passed - Valid encounter within last 6 months    Recent Outpatient Visits           4 months ago General medical exam   [facility] [place], [place] DASEN, MD

## 2024-05-19 ENCOUNTER — Other Ambulatory Visit: Payer: Self-pay | Admitting: Family Medicine

## 2024-10-09 ENCOUNTER — Encounter
